# Patient Record
Sex: Male | Born: 1955 | Race: White | Hispanic: No | Marital: Single | Smoking: Current every day smoker
Health system: Southern US, Community
[De-identification: ages and names within clinical notes are randomized; demographics above are authoritative.]

## PROBLEM LIST (undated history)

## (undated) DIAGNOSIS — M1990 Unspecified osteoarthritis, unspecified site: Secondary | ICD-10-CM

## (undated) DIAGNOSIS — G709 Myoneural disorder, unspecified: Secondary | ICD-10-CM

## (undated) HISTORY — PX: APPENDECTOMY: SHX54

## (undated) HISTORY — DX: Unspecified osteoarthritis, unspecified site: M19.90

## (undated) HISTORY — DX: Myoneural disorder, unspecified: G70.9

---

## 2004-10-21 ENCOUNTER — Emergency Department (HOSPITAL_COMMUNITY): Admission: EM | Admit: 2004-10-21 | Discharge: 2004-10-21 | Payer: Self-pay | Admitting: Emergency Medicine

## 2005-02-19 ENCOUNTER — Ambulatory Visit (HOSPITAL_COMMUNITY): Admission: RE | Admit: 2005-02-19 | Discharge: 2005-02-21 | Payer: Self-pay | Admitting: Orthopedic Surgery

## 2006-04-08 ENCOUNTER — Ambulatory Visit (HOSPITAL_COMMUNITY): Admission: RE | Admit: 2006-04-08 | Discharge: 2006-04-08 | Payer: Self-pay | Admitting: Internal Medicine

## 2006-04-08 ENCOUNTER — Ambulatory Visit: Payer: Self-pay | Admitting: Internal Medicine

## 2006-04-15 ENCOUNTER — Ambulatory Visit: Payer: Self-pay | Admitting: Internal Medicine

## 2006-04-18 ENCOUNTER — Ambulatory Visit: Payer: Self-pay | Admitting: *Deleted

## 2006-04-21 ENCOUNTER — Encounter: Admission: RE | Admit: 2006-04-21 | Discharge: 2006-05-05 | Payer: Self-pay | Admitting: Internal Medicine

## 2006-10-27 ENCOUNTER — Ambulatory Visit: Payer: Self-pay | Admitting: Internal Medicine

## 2006-11-04 ENCOUNTER — Ambulatory Visit: Payer: Self-pay | Admitting: Internal Medicine

## 2007-01-04 ENCOUNTER — Encounter (INDEPENDENT_AMBULATORY_CARE_PROVIDER_SITE_OTHER): Payer: Self-pay | Admitting: *Deleted

## 2007-02-01 ENCOUNTER — Telehealth (INDEPENDENT_AMBULATORY_CARE_PROVIDER_SITE_OTHER): Payer: Self-pay | Admitting: *Deleted

## 2007-02-01 DIAGNOSIS — K219 Gastro-esophageal reflux disease without esophagitis: Secondary | ICD-10-CM | POA: Insufficient documentation

## 2007-02-01 DIAGNOSIS — M545 Low back pain: Secondary | ICD-10-CM

## 2007-02-01 DIAGNOSIS — E785 Hyperlipidemia, unspecified: Secondary | ICD-10-CM | POA: Insufficient documentation

## 2007-02-10 ENCOUNTER — Ambulatory Visit: Payer: Self-pay | Admitting: Internal Medicine

## 2007-02-10 DIAGNOSIS — G5600 Carpal tunnel syndrome, unspecified upper limb: Secondary | ICD-10-CM

## 2007-02-14 ENCOUNTER — Telehealth (INDEPENDENT_AMBULATORY_CARE_PROVIDER_SITE_OTHER): Payer: Self-pay | Admitting: Internal Medicine

## 2007-02-16 ENCOUNTER — Ambulatory Visit: Payer: Self-pay | Admitting: Internal Medicine

## 2007-03-07 LAB — CONVERTED CEMR LAB
Albumin: 4.4 g/dL (ref 3.5–5.2)
Alkaline Phosphatase: 43 units/L (ref 39–117)
CO2: 26 meq/L (ref 19–32)
Calcium: 9.7 mg/dL (ref 8.4–10.5)
Cholesterol: 208 mg/dL — ABNORMAL HIGH (ref 0–200)
Glucose, Bld: 100 mg/dL — ABNORMAL HIGH (ref 70–99)
Potassium: 5.1 meq/L (ref 3.5–5.3)
Sodium: 143 meq/L (ref 135–145)
Total Protein: 7 g/dL (ref 6.0–8.3)

## 2007-04-17 ENCOUNTER — Ambulatory Visit: Payer: Self-pay | Admitting: Nurse Practitioner

## 2007-04-17 DIAGNOSIS — M6530 Trigger finger, unspecified finger: Secondary | ICD-10-CM | POA: Insufficient documentation

## 2007-04-24 ENCOUNTER — Ambulatory Visit: Payer: Self-pay | Admitting: Internal Medicine

## 2007-04-26 ENCOUNTER — Encounter (INDEPENDENT_AMBULATORY_CARE_PROVIDER_SITE_OTHER): Payer: Self-pay | Admitting: Internal Medicine

## 2007-04-26 LAB — CONVERTED CEMR LAB
Albumin: 4.1 g/dL (ref 3.5–5.2)
Alkaline Phosphatase: 43 units/L (ref 39–117)
BUN: 24 mg/dL — ABNORMAL HIGH (ref 6–23)
Basophils Relative: 1 % (ref 0–1)
Chloride: 103 meq/L (ref 96–112)
Eosinophils Absolute: 0.1 10*3/uL (ref 0.0–0.7)
Eosinophils Relative: 2 % (ref 0–5)
Glucose, Bld: 85 mg/dL (ref 70–99)
HCT: 45.5 % (ref 39.0–52.0)
Hemoglobin: 14.7 g/dL (ref 13.0–17.0)
LDL Cholesterol: 103 mg/dL — ABNORMAL HIGH (ref 0–99)
MCHC: 32.3 g/dL (ref 30.0–36.0)
MCV: 90.5 fL (ref 78.0–100.0)
Monocytes Absolute: 0.6 10*3/uL (ref 0.1–1.0)
Monocytes Relative: 10 % (ref 3–12)
RBC: 5.03 M/uL (ref 4.22–5.81)
Total CHOL/HDL Ratio: 4.7
Triglycerides: 261 mg/dL — ABNORMAL HIGH (ref <150)
VLDL: 52 mg/dL — ABNORMAL HIGH (ref 0–40)

## 2007-05-11 ENCOUNTER — Ambulatory Visit: Payer: Self-pay | Admitting: Internal Medicine

## 2007-05-11 DIAGNOSIS — K089 Disorder of teeth and supporting structures, unspecified: Secondary | ICD-10-CM | POA: Insufficient documentation

## 2007-05-12 ENCOUNTER — Telehealth (INDEPENDENT_AMBULATORY_CARE_PROVIDER_SITE_OTHER): Payer: Self-pay | Admitting: Nurse Practitioner

## 2007-05-16 ENCOUNTER — Ambulatory Visit (HOSPITAL_COMMUNITY): Admission: RE | Admit: 2007-05-16 | Discharge: 2007-05-16 | Payer: Self-pay | Admitting: Internal Medicine

## 2007-11-21 ENCOUNTER — Ambulatory Visit: Payer: Self-pay | Admitting: Internal Medicine

## 2007-11-21 DIAGNOSIS — R197 Diarrhea, unspecified: Secondary | ICD-10-CM | POA: Insufficient documentation

## 2007-11-21 DIAGNOSIS — L301 Dyshidrosis [pompholyx]: Secondary | ICD-10-CM | POA: Insufficient documentation

## 2007-11-21 DIAGNOSIS — L290 Pruritus ani: Secondary | ICD-10-CM | POA: Insufficient documentation

## 2007-12-19 ENCOUNTER — Encounter (INDEPENDENT_AMBULATORY_CARE_PROVIDER_SITE_OTHER): Payer: Self-pay | Admitting: *Deleted

## 2007-12-19 LAB — CONVERTED CEMR LAB
OCCULT 1: NEGATIVE
OCCULT 3: NEGATIVE

## 2008-01-19 ENCOUNTER — Ambulatory Visit: Payer: Self-pay | Admitting: Internal Medicine

## 2008-02-23 ENCOUNTER — Ambulatory Visit: Payer: Self-pay | Admitting: Internal Medicine

## 2008-02-23 LAB — CONVERTED CEMR LAB
ALT: 32 units/L (ref 0–53)
Alkaline Phosphatase: 54 units/L (ref 39–117)
BUN: 17 mg/dL (ref 6–23)
Basophils Relative: 0 % (ref 0–1)
Chloride: 106 meq/L (ref 96–112)
Cholesterol: 196 mg/dL (ref 0–200)
Glucose, Bld: 110 mg/dL — ABNORMAL HIGH (ref 70–99)
Glucose, Urine, Semiquant: NEGATIVE
LDL Cholesterol: 102 mg/dL — ABNORMAL HIGH (ref 0–99)
Lymphs Abs: 2.4 10*3/uL (ref 0.7–4.0)
Monocytes Relative: 11 % (ref 3–12)
Neutro Abs: 2.2 10*3/uL (ref 1.7–7.7)
Neutrophils Relative %: 42 % — ABNORMAL LOW (ref 43–77)
Nitrite: NEGATIVE
Platelets: 232 10*3/uL (ref 150–400)
Potassium: 5.2 meq/L (ref 3.5–5.3)
RBC: 4.84 M/uL (ref 4.22–5.81)
Sodium: 142 meq/L (ref 135–145)
Total Bilirubin: 0.6 mg/dL (ref 0.3–1.2)
Total CHOL/HDL Ratio: 5.3
Total Protein: 6.8 g/dL (ref 6.0–8.3)
Triglycerides: 286 mg/dL — ABNORMAL HIGH (ref <150)
VLDL: 57 mg/dL — ABNORMAL HIGH (ref 0–40)
WBC Urine, dipstick: NEGATIVE
WBC: 5.3 10*3/uL (ref 4.0–10.5)
pH: 5

## 2008-03-02 ENCOUNTER — Encounter (INDEPENDENT_AMBULATORY_CARE_PROVIDER_SITE_OTHER): Payer: Self-pay | Admitting: Internal Medicine

## 2008-03-02 DIAGNOSIS — R7309 Other abnormal glucose: Secondary | ICD-10-CM | POA: Insufficient documentation

## 2008-12-05 ENCOUNTER — Ambulatory Visit: Payer: Self-pay | Admitting: Internal Medicine

## 2008-12-05 DIAGNOSIS — H6120 Impacted cerumen, unspecified ear: Secondary | ICD-10-CM | POA: Insufficient documentation

## 2008-12-05 DIAGNOSIS — L909 Atrophic disorder of skin, unspecified: Secondary | ICD-10-CM | POA: Insufficient documentation

## 2008-12-05 DIAGNOSIS — L919 Hypertrophic disorder of the skin, unspecified: Secondary | ICD-10-CM

## 2009-02-27 ENCOUNTER — Ambulatory Visit: Payer: Self-pay | Admitting: Internal Medicine

## 2009-02-27 LAB — CONVERTED CEMR LAB
ALT: 36 units/L (ref 0–53)
AST: 26 units/L (ref 0–37)
Alkaline Phosphatase: 53 units/L (ref 39–117)
Basophils Absolute: 0 10*3/uL (ref 0.0–0.1)
Basophils Relative: 1 % (ref 0–1)
CO2: 24 meq/L (ref 19–32)
Cholesterol: 191 mg/dL (ref 0–200)
Creatinine, Ser: 1.06 mg/dL (ref 0.40–1.50)
Eosinophils Relative: 2 % (ref 0–5)
Glucose, Urine, Semiquant: NEGATIVE
HCT: 46.5 % (ref 39.0–52.0)
Hemoglobin: 15.4 g/dL (ref 13.0–17.0)
MCHC: 33.1 g/dL (ref 30.0–36.0)
Monocytes Absolute: 0.5 10*3/uL (ref 0.1–1.0)
Neutro Abs: 2.7 10*3/uL (ref 1.7–7.7)
Platelets: 194 10*3/uL (ref 150–400)
RDW: 12.9 % (ref 11.5–15.5)
Total Bilirubin: 0.5 mg/dL (ref 0.3–1.2)
Total CHOL/HDL Ratio: 4.7
VLDL: 32 mg/dL (ref 0–40)
WBC Urine, dipstick: NEGATIVE
pH: 5

## 2009-03-07 ENCOUNTER — Ambulatory Visit: Payer: Self-pay | Admitting: Internal Medicine

## 2009-03-07 DIAGNOSIS — F1010 Alcohol abuse, uncomplicated: Secondary | ICD-10-CM

## 2009-03-07 DIAGNOSIS — R030 Elevated blood-pressure reading, without diagnosis of hypertension: Secondary | ICD-10-CM

## 2009-03-07 LAB — CONVERTED CEMR LAB
Glucose, Urine, Semiquant: NEGATIVE
Nitrite: NEGATIVE
Protein, U semiquant: 30
WBC Urine, dipstick: NEGATIVE

## 2009-04-07 ENCOUNTER — Encounter (INDEPENDENT_AMBULATORY_CARE_PROVIDER_SITE_OTHER): Payer: Self-pay | Admitting: Internal Medicine

## 2009-04-24 ENCOUNTER — Encounter (INDEPENDENT_AMBULATORY_CARE_PROVIDER_SITE_OTHER): Payer: Self-pay | Admitting: *Deleted

## 2009-04-28 ENCOUNTER — Ambulatory Visit: Payer: Self-pay | Admitting: Gastroenterology

## 2009-05-12 ENCOUNTER — Telehealth: Payer: Self-pay | Admitting: Gastroenterology

## 2009-09-04 ENCOUNTER — Telehealth (INDEPENDENT_AMBULATORY_CARE_PROVIDER_SITE_OTHER): Payer: Self-pay | Admitting: Internal Medicine

## 2009-09-09 ENCOUNTER — Telehealth (INDEPENDENT_AMBULATORY_CARE_PROVIDER_SITE_OTHER): Payer: Self-pay | Admitting: Internal Medicine

## 2009-09-09 ENCOUNTER — Ambulatory Visit: Payer: Self-pay | Admitting: Internal Medicine

## 2009-11-05 ENCOUNTER — Ambulatory Visit (HOSPITAL_COMMUNITY): Admission: RE | Admit: 2009-11-05 | Discharge: 2009-11-05 | Payer: Self-pay | Admitting: Internal Medicine

## 2009-11-21 ENCOUNTER — Ambulatory Visit (HOSPITAL_COMMUNITY): Admission: RE | Admit: 2009-11-21 | Discharge: 2009-11-21 | Payer: Self-pay | Admitting: Internal Medicine

## 2009-12-04 DIAGNOSIS — M5126 Other intervertebral disc displacement, lumbar region: Secondary | ICD-10-CM

## 2010-03-10 ENCOUNTER — Telehealth (INDEPENDENT_AMBULATORY_CARE_PROVIDER_SITE_OTHER): Payer: Self-pay | Admitting: Internal Medicine

## 2010-03-10 ENCOUNTER — Ambulatory Visit: Payer: Self-pay | Admitting: Internal Medicine

## 2010-03-10 DIAGNOSIS — R5383 Other fatigue: Secondary | ICD-10-CM

## 2010-03-10 DIAGNOSIS — E049 Nontoxic goiter, unspecified: Secondary | ICD-10-CM | POA: Insufficient documentation

## 2010-03-10 DIAGNOSIS — R5381 Other malaise: Secondary | ICD-10-CM

## 2010-03-10 DIAGNOSIS — R079 Chest pain, unspecified: Secondary | ICD-10-CM | POA: Insufficient documentation

## 2010-03-10 LAB — CONVERTED CEMR LAB
Blood Glucose, Fingerstick: 138
Blood in Urine, dipstick: NEGATIVE
Ketones, urine, test strip: NEGATIVE
Nitrite: NEGATIVE
Urobilinogen, UA: 0.2
WBC Urine, dipstick: NEGATIVE

## 2010-03-31 ENCOUNTER — Encounter (INDEPENDENT_AMBULATORY_CARE_PROVIDER_SITE_OTHER): Payer: Self-pay | Admitting: *Deleted

## 2010-03-31 LAB — CONVERTED CEMR LAB
AST: 21 units/L (ref 0–37)
Alkaline Phosphatase: 47 units/L (ref 39–117)
BUN: 8 mg/dL (ref 6–23)
Basophils Relative: 0 % (ref 0–1)
Creatinine, Ser: 0.98 mg/dL (ref 0.40–1.50)
Eosinophils Absolute: 0.1 10*3/uL (ref 0.0–0.7)
Glucose, Bld: 90 mg/dL (ref 70–99)
HCT: 46.4 % (ref 39.0–52.0)
Hemoglobin: 15.2 g/dL (ref 13.0–17.0)
LDL Cholesterol: 78 mg/dL (ref 0–99)
Lymphs Abs: 2.6 10*3/uL (ref 0.7–4.0)
MCHC: 32.8 g/dL (ref 30.0–36.0)
MCV: 92.6 fL (ref 78.0–100.0)
Monocytes Absolute: 0.6 10*3/uL (ref 0.1–1.0)
Monocytes Relative: 9 % (ref 3–12)
Total Protein: 6.7 g/dL (ref 6.0–8.3)
Triglycerides: 241 mg/dL — ABNORMAL HIGH (ref <150)
VLDL: 48 mg/dL — ABNORMAL HIGH (ref 0–40)

## 2010-04-08 ENCOUNTER — Encounter (INDEPENDENT_AMBULATORY_CARE_PROVIDER_SITE_OTHER): Payer: Self-pay | Admitting: Internal Medicine

## 2010-04-08 ENCOUNTER — Ambulatory Visit: Payer: Self-pay | Admitting: Internal Medicine

## 2010-04-15 ENCOUNTER — Encounter (INDEPENDENT_AMBULATORY_CARE_PROVIDER_SITE_OTHER): Payer: Self-pay | Admitting: *Deleted

## 2010-04-15 ENCOUNTER — Ambulatory Visit: Payer: Self-pay | Admitting: Internal Medicine

## 2010-04-29 ENCOUNTER — Encounter: Payer: Self-pay | Admitting: Internal Medicine

## 2010-04-29 ENCOUNTER — Ambulatory Visit
Admission: RE | Admit: 2010-04-29 | Discharge: 2010-04-29 | Payer: Self-pay | Source: Home / Self Care | Attending: Internal Medicine | Admitting: Internal Medicine

## 2010-04-29 DIAGNOSIS — D128 Benign neoplasm of rectum: Secondary | ICD-10-CM

## 2010-04-29 DIAGNOSIS — D129 Benign neoplasm of anus and anal canal: Secondary | ICD-10-CM

## 2010-05-05 ENCOUNTER — Encounter: Payer: Self-pay | Admitting: Internal Medicine

## 2010-05-19 NOTE — Letter (Signed)
Summary: [facility] Instructions  [facility]  [address] [place], [place] [address]   Phone: [phone]  Fax: [phone]       Travis Weber    03-27-1956    MRN: [id number]        Procedure Day /Date:  Monday 05/12/2009     Arrival Time:  9:30 am     Procedure Time:  10:30 am     Location of Procedure:                    _x _  [facility] (4th Floor)                        PREPARATION FOR COLONOSCOPY WITH MOVIPREP   Starting 5 days prior to your procedure Wednesday 1/19 do not eat nuts, seeds, popcorn, corn, beans, peas,  salads, or any raw vegetables.  Do not take any fiber supplements (e.g. Metamucil, Citrucel, and Benefiber).  THE DAY BEFORE YOUR PROCEDURE         DATE: Sunday 1/23  1.  Drink clear liquids the entire day-NO SOLID FOOD  2.  Do not drink anything colored red or purple.  Avoid juices with pulp.  No orange juice.  3.  Drink at least 64 oz. (8 glasses) of fluid/clear liquids during the day to prevent dehydration and help the prep work efficiently.  CLEAR LIQUIDS INCLUDE: Water Jello Ice Popsicles Tea (sugar ok, no milk/cream) Powdered fruit flavored drinks Coffee (sugar ok, no milk/cream) Gatorade Juice: apple, white grape, white cranberry  Lemonade Clear bullion, consomm, broth Carbonated beverages (any kind) Strained chicken noodle soup Hard Candy                             4.  In the morning, mix first dose of MoviPrep solution:    Empty 1 Pouch A and 1 Pouch B into the disposable container    Add lukewarm drinking water to the top line of the container. Mix to dissolve    Refrigerate (mixed solution should be used within 24 hrs)  5.  Begin drinking the prep at 5:00 p.m. The MoviPrep container is divided by 4 marks.   Every 15 minutes drink the solution down to the next mark (approximately 8 oz) until the full liter is complete.   6.  Follow completed prep with 16 oz of clear liquid of your choice (Nothing  red or purple).  Continue to drink clear liquids until bedtime.  7.  Before going to bed, mix second dose of MoviPrep solution:    Empty 1 Pouch A and 1 Pouch B into the disposable container    Add lukewarm drinking water to the top line of the container. Mix to dissolve    Refrigerate  THE DAY OF YOUR PROCEDURE      DATE: Monday 1/24  Beginning at 5:30 am (5 hours before procedure):         1. Every 15 minutes, drink the solution down to the next mark (approx 8 oz) until the full liter is complete.  2. Follow completed prep with 16 oz. of clear liquid of your choice.    3. You may drink clear liquids until 8:30 am (2 HOURS BEFORE PROCEDURE).   MEDICATION INSTRUCTIONS  Unless otherwise instructed, you should take regular prescription medications with a small sip of water   as early as possible the morning  of your procedure.          OTHER INSTRUCTIONS  You will need a responsible adult at least 57 years of age to accompany you and drive you home.   This person must remain in the waiting room during your procedure.  Wear loose fitting clothing that is easily removed.  Leave jewelry and other valuables at home.  However, you may wish to bring a book to read or  an iPod/MP3 player to listen to music as you wait for your procedure to start.  Remove all body piercing jewelry and leave at home.  Total time from sign-in until discharge is approximately 2-3 hours.  You should go home directly after your procedure and rest.  You can resume normal activities the  day after your procedure.  The day of your procedure you should not:   Drive   Make legal decisions   Operate machinery   Drink alcohol   Return to work  You will receive specific instructions about eating, activities and medications before you leave.    The above instructions have been reviewed and explained to me by   Ezra Sites RN  April 28, 2009 3:57 PM     I fully understand and can  verbalize these instructions _____________________________ Date _________

## 2010-05-19 NOTE — Assessment & Plan Note (Signed)
Summary: BACK PAIN//GK   Vital Signs:  Patient profile:   55 year old male Weight:      170 pounds Temp:     97.7 degrees F oral Pulse rate:   80 / minute Pulse rhythm:   regular Resp:     18 per minute BP sitting:   120 / 75  (left arm) Cuff size:   large  Vitals Entered By: Linzie Collin CC: Back pain,carrpol tunnel symptoms,pain down right leg, right side of back, taking tylenol during the day Is Patient Diabetic? No Pain Assessment Patient in pain? yes     Location: lower back Intensity: 4 Type: dull Onset of pain  Constant  Does patient need assistance? Functional Status Self care Ambulation Normal   CC:  Back pain, carrpol tunnel symptoms, pain down right leg, right side of back, and taking tylenol during the day.  History of Present Illness: 1.  Elevated BP:  BP was up a bit at CPE.  Fine today.  Exercising more so.  Weight down a bit.  Back and forth with alcohol.  Does use MJ and alcohol at times for pain.  2.  Low back pain--mainly on right with radiation down right legs.  Feels like he can never get comfortable.    3.  Carpal Tunnel, bilateral.  Left worse than right.  Bothers him throughout night--affects sleeping.  Also with symptoms now during day.  Wearing splints and takes Naproxen as needed   Current Medications (verified): 1)  Lovaza 1 Gm  Caps (Omega-3-Acid Ethyl Esters) .... 4 Caps By Mouth Daily 2)  Protonix 40 Mg  Tbec (Pantoprazole Sodium) .Marland Kitchen.. 1 Tab By Mouth Daily 3)  Lipitor 40 Mg  Tabs (Atorvastatin Calcium) .Marland Kitchen.. 1 Tab By Mouth Daily 4)  Naprosyn 500 Mg  Tabs (Naproxen) .... Take 1 Tablet By Mouth Two Times A Day As Needed 5)  Clobetasol Propionate 0.05 % Crea (Clobetasol Propionate) .... Apply Two Times A Day To Hands --Wear Gloves Over Hands After Applying At Night  Allergies (verified): No Known Drug Allergies  Physical Exam  Msk:  Tender over spinous processes of lumbar spine.  No paraspinous muscle tenderness. Extremities:   muscle mass of thenar and hypothenar eminences normal.   Neurologic:  strength normal in all extremities, gait normal, and DTRs symmetrical and normal.   Positive Tinels and Phalens bilaterally over median nerve at volar wrist Good grip bilaterally   Impression & Recommendations:  Problem # 1:  LOW BACK PAIN SYNDROME (ICD-724.2)  If Xray suggests a benefit, will set up with interventional radiology for injection. His updated medication list for this problem includes:    Naprosyn 500 Mg Tabs (Naproxen) .Marland Kitchen... Take 1 tablet by mouth two times a day as needed  Orders: Diagnostic X-Ray/Fluoroscopy (Diagnostic X-Ray/Flu)  Problem # 2:  CARPAL TUNNEL SYNDROME, BILATERAL (ICD-354.0)  Not sure cock up splints working well for him. Symptoms pretty much at night, but also having during day.    Orders: Orthopedic Surgeon Referral (Ortho Surgeon)  Complete Medication List: 1)  Lovaza 1 Gm Caps (Omega-3-acid ethyl esters) .... 4 caps by mouth daily 2)  Protonix 40 Mg Tbec (Pantoprazole sodium) .Marland Kitchen.. 1 tab by mouth daily 3)  Lipitor 40 Mg Tabs (Atorvastatin calcium) .Marland Kitchen.. 1 tab by mouth daily 4)  Naprosyn 500 Mg Tabs (Naproxen) .... Take 1 tablet by mouth two times a day as needed 5)  Clobetasol Propionate 0.05 % Crea (Clobetasol propionate) .... Apply two times a day to hands --  wear gloves over hands after applying at night  Patient Instructions: 1)  CPE in October with Dr. Ovidio Hanger it a fasting visit so get fasting labs Prescriptions: NAPROSYN 500 MG  TABS (NAPROXEN) Take 1 tablet by mouth two times a day as needed  #60 x 4   Entered and Authorized by:   Julieanne Manson MD   Signed by:   Julieanne Manson MD on 09/09/2009   Method used:   Faxed to ...       [facility] - [facility] (retail)       [address] [place], [place]  [address]       Ph: [phone]       Fax: [phone]   RxID:   [id number]   Appended Document: BACK  PAIN//GK pt given a new left cock up splint from donations--see if helps

## 2010-05-19 NOTE — Progress Notes (Signed)
Summary: GI referral for colonoscopy  Phone Note Outgoing Call   Summary of Call: Nora--GI referral for colonoscopy Initial call taken by: Julieanne Manson MD,  March 10, 2010 10:31 AM  Follow-up for Phone Call        I send the referral today to Brooke Dare because the pt was seeing in november and Enid Baas was on  call these month and they wont see it unless he have an office visit in December . Waiting for an appt Follow-up by: Cheryll Dessert,  March 20, 2010 11:16 AM

## 2010-05-19 NOTE — Progress Notes (Signed)
  Phone Note Outgoing Call   Summary of Call: Debra--see if can get in at [facility] ortho or [facility] for this with hand surgeon.  referral and letter. Initial call taken by: Julieanne Manson MD,  Sep 09, 2009 12:57 PM

## 2010-05-19 NOTE — Progress Notes (Signed)
Summary: Office Visit//DEPRESSION SCREENING  Office Visit//DEPRESSION SCREENING   Imported By: Arta Bruce 03/10/2010 14:41:56  _____________________________________________________________________  External Attachment:    Type:   Image     Comment:   External Document

## 2010-05-19 NOTE — Letter (Signed)
Summary: *Referral Letter  [facility]  [address] [place], [place] [address]   Phone: [phone]  Fax: [phone]    09/09/2009  Thank you in advance for agreeing to see my patient:  Travis Weber [address] [place], [place]  [address]  Phone: [phone]  Reason for Referral: Bilateral carpal tunnel syndrome not responding to conservative treatment.  Wearing cock up splints regularly and using Naproxen.  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) 10)  PRURITUS ANI (ICD-698.0) 11)  DENTAL PAIN (ICD-525.9) 12)  TRIGGER FINGER, LEFT THUMB (ICD-727.03) 13)  CARPAL TUNNEL SYNDROME, BILATERAL (ICD-354.0) 14)  LOW BACK PAIN SYNDROME (ICD-724.2) 15)  HYPERLIPIDEMIA (ICD-272.4) 16)  GERD (ICD-530.81)   Current Medications: 1)  LOVAZA 1 GM  CAPS (OMEGA-3-ACID ETHYL ESTERS) 4 caps by mouth daily 2)  PROTONIX 40 MG  TBEC (PANTOPRAZOLE SODIUM) 1 tab by mouth daily 3)  LIPITOR 40 MG  TABS (ATORVASTATIN CALCIUM) 1 tab by mouth daily 4)  NAPROSYN 500 MG  TABS (NAPROXEN) Take 1 tablet by mouth two times a day as needed 5)  CLOBETASOL PROPIONATE 0.05 % CREA (CLOBETASOL PROPIONATE) apply two times a day to hands --wear gloves over hands after applying at night   Past Medical History: 1)  DIARRHEA, RECURRENT (ICD-787.91) 2)  DYSHIDROTIC ECZEMA, 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  Sincerely,  Julieanne Manson MD

## 2010-05-19 NOTE — Progress Notes (Signed)
  Phone Note Outgoing Call   Summary of Call: Rec'd request for refill on Naproxen. Defer to Dr. Delrae Alfred.  Last seen in Nov 2010.  Was to f/u for BP in 2 mos.   Initial call taken by: Brynda Rim,  Sep 04, 2009 11:00 PM  Follow-up for Phone Call        Left message on answering machine for pt to return call. Follow-up by: Vesta Mixer CMA,  Sep 08, 2009 11:00 AM  Additional Follow-up for Phone Call Additional follow up Details #1::        Pt notified and Graciela to schedule appt. Additional Follow-up by: Vesta Mixer CMA,  Sep 08, 2009 12:11 PM    Prescriptions: NAPROSYN 500 MG  TABS (NAPROXEN) Take 1 tablet by mouth two times a day as needed  #60 x 1   Entered and Authorized by:   Julieanne Manson MD   Signed by:   Julieanne Manson MD on 09/08/2009   Method used:   Faxed to ...       [facility] - [facility] (retail)       [address] [place], [place]  [address]       Ph: [phone]       Fax: [phone]   RxID:   [id number]  Please call pt. and let him know he needs to come in for a bp check for more refills of Naproxen

## 2010-05-19 NOTE — Miscellaneous (Signed)
Summary: [facility]  Clinical Lists Changes  Medications: Added new medication of MOVIPREP 100 GM  SOLR (PEG-KCL-NACL-NASULF-NA ASC-C) As per prep instructions. - Signed Rx of MOVIPREP 100 GM  SOLR (PEG-KCL-NACL-NASULF-NA ASC-C) As per prep instructions.;  #1 x 0;  Signed;  Entered by: Ezra Sites RN;  Authorized by: Rachael Fee MD;  Method used: Electronically to [facility]*, [address], [place], [place]  [id number], Ph: [phone], Fax: [phone] Observations: Added new observation of NKA: T (04/28/2009 15:35)    Prescriptions: MOVIPREP 100 GM  SOLR (PEG-KCL-NACL-NASULF-NA ASC-C) As per prep instructions.  #1 x 0   Entered by:   Ezra Sites RN   Authorized by:   Rachael Fee MD   Signed by:   Ezra Sites RN on 04/28/2009   Method used:   Electronically to        [facility]* (retail)       [address]       [place], [place]  [id number]       Ph: [phone]       Fax: [phone]   RxID:   [id number]

## 2010-05-19 NOTE — Progress Notes (Signed)
Summary: No Show/Late Cancellation  ---- Converted from flag ---- ---- 05/09/2009 3:18 PM, Tawni Levy wrote:   ---- 05/09/2009 10:11 AM, Rachael Fee MD wrote: yes, that is too late to cancel  ---- 05/09/2009 10:09 AM, Tawni Levy wrote: Patient cancelled his procedure for Monday because he is having fianancial problems and problems with his insurance eligibility.   Are you going to charge? ------------------------------  Phone Note Other Incoming   Summary of Call: Patient BILLED. Initial call taken by: Leanor Kail [facility],  May 12, 2009 9:29 AM

## 2010-05-21 NOTE — Miscellaneous (Signed)
Summary: [facility] Previsit/prep  Clinical Lists Changes  Medications: Added new medication of DULCOLAX 5 MG  TBEC (BISACODYL) Day before procedure take 2 at 3pm and 2 at 8pm. - Signed Added new medication of METOCLOPRAMIDE HCL 10 MG  TABS (METOCLOPRAMIDE HCL) As per prep instructions. - Signed Added new medication of MIRALAX   POWD (POLYETHYLENE GLYCOL 3350) As per prep  instructions. - Signed Rx of DULCOLAX 5 MG  TBEC (BISACODYL) Day before procedure take 2 at 3pm and 2 at 8pm.;  #4 x 0;  Signed;  Entered by: Wyona Almas RN;  Authorized by: Hilarie Fredrickson MD;  Method used: Faxed to [facility], [address] [place], [place]  [address], Ph: [phone], Fax: [phone] Rx of METOCLOPRAMIDE HCL 10 MG  TABS (METOCLOPRAMIDE HCL) As per prep instructions.;  #2 x 0;  Signed;  Entered by: Wyona Almas RN;  Authorized by: Hilarie Fredrickson MD;  Method used: Faxed to [facility], [address] [place], [place]  [address], Ph: [phone], Fax: [phone] Rx of MIRALAX   POWD (POLYETHYLENE GLYCOL 3350) As per prep  instructions.;  #255gm x 0;  Signed;  Entered by: Wyona Almas RN;  Authorized by: Hilarie Fredrickson MD;  Method used: Faxed to [facility], [address] [place], [place]  [address], Ph: [phone], Fax: [phone]    Prescriptions: MIRALAX   POWD (POLYETHYLENE GLYCOL 3350) As per prep  instructions.  #255gm x 0   Entered by:   Wyona Almas RN   Authorized by:   Hilarie Fredrickson MD   Signed by:   Wyona Almas RN on 04/15/2010   Method used:   Faxed to ...       [facility] - [facility] (retail)       [address] [place], [place]  [address]       Ph: [phone]       Fax: [phone]   RxID:   [id number] METOCLOPRAMIDE HCL 10 MG  TABS (METOCLOPRAMIDE HCL) As per prep instructions.  #2 x 0   Entered by:   Wyona Almas RN  Authorized by:   Hilarie Fredrickson MD   Signed by:   Wyona Almas RN on 04/15/2010   Method used:   Faxed to ...       [facility] (retail)       [address] [place], [place]  [address]       Ph: [phone]       Fax: [phone]   RxID:   [id number] DULCOLAX 5 MG  TBEC (BISACODYL) Day before procedure take 2 at 3pm and 2 at 8pm.  #4 x 0   Entered by:   Wyona Almas RN   Authorized by:   Hilarie Fredrickson MD   Signed by:   Wyona Almas RN on 04/15/2010   Method used:   Faxed to ...       [facility] - Pharmac (retail)       [address] [place], [place]  [address]       Ph: [phone]       Fax: [phone]   RxID:   [id number]

## 2010-05-21 NOTE — Assessment & Plan Note (Signed)
Summary: CHOLESTEROL & FLP //MC   Vital Signs:  Patient profile:   55 year old male Height:      66 inches Weight:      164.31 pounds BMI:     26.62 BSA:     1.84 Temp:     96.8 degrees F oral Pulse rate:   72 / minute Pulse rhythm:   regular Resp:     18 per minute BP sitting:   118 / 80  (left arm) Cuff size:   regular  Vitals Entered By: Hale Drone, CMA (March 10, 2010 9:00 AM) CC: Here for a 55 Y/O CP.  Is Patient Diabetic? No Pain Assessment Patient in pain? no      CBG Result 138 CBG Device ID B Fasting  Does patient need assistance? Functional Status Self care Ambulation Normal  Vision Screening:Left eye with correction: 20 / 20 Right eye with correction: 20 / 20 Both eyes with correction: 20 / 20        Vision Entered By: Hale Drone CMA (March 10, 2010 9:13 AM)   CC:  Here for a 55 Y/O CP. Marland Kitchen  History of Present Illness: 55 yo male here for CPE.    Concerns:  1.  Hyperglycemia:  Has had some mild weight loss.  Was ill a few weeks ago--diarrhea.  Feels he has been eating better as he now lives in an airstream trailer and cooking more for himself.  No definite polydipsia or polyuria.  Has cut back on bread and carbs in regular diet, but does eat a lot of sweets/candy.  Current Medications (verified): 1)  Lovaza 1 Gm  Caps (Omega-3-Acid Ethyl Esters) .... 4 Caps By Mouth Daily 2)  Protonix 40 Mg  Tbec (Pantoprazole Sodium) .Marland Kitchen.. 1 Tab By Mouth Daily 3)  Lipitor 40 Mg  Tabs (Atorvastatin Calcium) .Marland Kitchen.. 1 Tab By Mouth Daily 4)  Naprosyn 500 Mg  Tabs (Naproxen) .... Take 1 Tablet By Mouth Two Times A Day As Needed 5)  Clobetasol Propionate 0.05 % Crea (Clobetasol Propionate) .... Apply Two Times A Day To Hands --Wear Gloves Over Hands After Applying At Night  Allergies (verified): No Known Drug Allergies  Past History:  Past Medical History: Reviewed history from 01/19/2008 and no changes required. 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 1.  2006:  Infected foreign body in left hand-- I and D with exploration.  Family History: Mother, 36:  Joint problems Father, died 54:  MI, hypercholesterolemia 5 siblings:  all with hypercholesterolemia, CTS, some with hypertension as well.  No DM. Daughter, 57:  Healthy  Social History: No longer lives with mother--she was in MVA--In [facility]. Artist -Careers adviser Divorced, sees daughter regularly--they recently moved to [place]. Tobacco:  started age 56, stopped mid 26s.  2 ppd--60 pack year history. Alcohol:  None in 3 weeks.  Has had difficulties in past.  Either drinks a lot or not at all.  Has done AA, support groups.  Feels he knows what he needs to do.  Discussed counseling here. Drugs:  MJ-uses nightly.  Eats more sweets with this.  No other drugs.  Review of Systems General:  Energy is down a bit.  Is planning to stop MJ and see if improves.  Does note he  gets emotionally flat for a month coming off MJ, then things improve.. Eyes:  glasses.  . ENT:  Not sure if hearing well--has to ask people to repeat themselves.. CV:  Denies palpitations; Chest discomfort for a couple of minutes--cannot say where in chest.  Was at rest when occurred.  SEveral friends with heart issues recently.  Pt. cannot give a good description.Marland Kitchen Resp:  Denies shortness of breath. GI:  Denies abdominal pain, bloody stools, constipation, dark tarry stools, and diarrhea. GU:  Denies dysuria, erectile dysfunction, urinary frequency, and urinary hesitancy. MS:  Denies joint pain, joint redness, and joint swelling. Derm:  Hands with dyshydrotic  exzema. Neuro:  Carpal tunnel not much of problem as not doing much currently.Marland Kitchen Psych:  Somewhat down.  Wants to see how he does with discontinuation of MJ/ETOH PHQ9 scored 4.  Physical Exam  General:  Well-developed,well-nourished,in no acute distress; alert,appropriate and cooperative throughout examination Head:  Normocephalic and atraumatic without obvious abnormalities. No apparent alopecia or balding. Eyes:  No corneal or conjunctival inflammation noted. EOMI. Perrla. Funduscopic exam benign, without hemorrhages, exudates or papilledema. Vision grossly normal. Ears:  External ear exam shows no significant lesions or deformities.  Otoscopic examination reveals clear canals, tympanic membranes are intact bilaterally without bulging, retraction, inflammation or discharge. Hearing is grossly normal bilaterally. Nose:  External nasal examination shows no deformity or inflammation. Nasal mucosa are pink and moist without lesions or exudates. Mouth:  Oral mucosa and oropharynx without lesions or exudates.  poor dentition and teeth missing.   Neck:  No deformities, masses, or tenderness noted.thyromegaly.   Chest Wall:  No deformities, masses, tenderness or gynecomastia noted. Lungs:  Normal respiratory effort, chest expands symmetrically. Lungs are clear to auscultation, no crackles or wheezes. Heart:  Normal rate and regular rhythm. S1 and S2 normal without gallop, murmur, click, rub or other extra sounds. Abdomen:  Bowel sounds positive,abdomen soft and non-tender without masses, organomegaly or hernias noted.  Well healed RLQ incisional scar Rectal:  No external abnormalities noted. Normal sphincter tone. No rectal masses or tenderness.  Heme negative light brown stool Genitalia:  Testes bilaterally descended without nodularity, tenderness or masses.Left varicocele unchanged. No penis lesions or urethral discharge. Prostate:  Prostate gland firm and smooth, no enlargement, nodularity,  tenderness, mass, asymmetry or induration. Msk:  No deformity or scoliosis noted of thoracic or lumbar spine.   Pulses:  R and L carotid,radial,femoral,dorsalis pedis and posterior tibial pulses are full and equal bilaterally Extremities:  No clubbing, cyanosis, edema, or deformity noted with normal full range of motion of all joints.   Neurologic:  No cranial nerve deficits noted. Station and gait are normal. Plantar reflexes are down-going bilaterally. DTRs are symmetrical throughout. Sensory, motor and coordinative functions appear intact. Skin:  Dry, cracking skin of hands/fingers Cervical Nodes:  No lymphadenopathy noted Axillary Nodes:  No palpable lymphadenopathy Inguinal Nodes:  No significant adenopathy Psych:  Cognition and judgment appear intact. Alert and cooperative with normal attention span and concentration. No apparent delusions, illusions, hallucinations   Impression & Recommendations:  Problem # 1:  HEALTH MAINTENANCE EXAM (ICD-V70.0)  PSA last year normal Guaiac cards x3 to return in 2 weeks. 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 not interested in referral to [facility] at this time--will call if he changes mind.  Problem # 4:  HYPERGLYCEMIA (ICD-790.29)  Orders: Hgb A1C ([address]UE)  Problem # 5:  CHEST PAIN (ICD-786.50) Pt. unable to characterize and sounds fleeting. Did not get EKG ordered today--will have pt. come back for that. Orders: T-CBC w/Diff (45409-81191)  Problem # 6:  FATIGUE (ICD-780.79)  Orders: T-CBC w/Diff (47829-56213)  Complete Medication List: 1)  Lovaza 1 Gm Caps (Omega-3-acid ethyl esters) .... 4 caps by mouth daily 2)  Protonix 40 Mg Tbec (Pantoprazole sodium) .Marland Kitchen.. 1 tab by mouth daily 3)  Lipitor  40 Mg Tabs (Atorvastatin calcium) .Marland Kitchen.. 1 tab by mouth daily 4)  Naprosyn 500 Mg Tabs (Naproxen) .... Take 1 tablet by mouth two times a day as needed 5)  Clobetasol Propionate 0.05 % Crea (Clobetasol propionate) .... Apply two times a day to hands --wear gloves over hands after applying at night  Other Orders: Capillary Blood Glucose/CBG (08657) T-Lipid Profile (84696-29528) T-Comprehensive Metabolic Panel (41324-40102)  Patient Instructions: 1)  Call if you do not hear about GI referral Prescriptions: CLOBETASOL PROPIONATE 0.05 % CREA (CLOBETASOL PROPIONATE) apply two times a day to hands --wear gloves over hands after applying at night  #60 g x 4   Entered and Authorized by:   Julieanne Manson MD   Signed by:   Julieanne Manson MD on 03/10/2010   Method used:   Faxed to ...       [facility] (retail)       [address] [place], [place]  [address]       Ph: [phone]       Fax: [phone]   RxID:   [id number] LIPITOR 40 MG  TABS (ATORVASTATIN CALCIUM) 1 tab by mouth daily  #30 x 11   Entered and Authorized by:   Julieanne Manson MD   Signed by:   Julieanne Manson MD on 03/10/2010   Method used:   Faxed to ...       [facility] - [facility] (retail)       [address] [place], [place]  [address]       Ph: [phone]       Fax: [phone]   RxID:   [id number] PROTONIX 40 MG  TBEC (PANTOPRAZOLE SODIUM) 1 tab by mouth daily  #30 x 11   Entered and Authorized by:   Julieanne Manson MD   Signed by:   Julieanne Manson MD on 03/10/2010   Method used:   Faxed to ...       [facility] (retail)       [address] [place], [place]  [address]       Ph: [phone]       Fax: [phone]   RxID:   [id number] LOVAZA 1 GM  CAPS (OMEGA-3-ACID ETHYL ESTERS) 4 caps by mouth daily  #120 x 11   Entered and Authorized by:    Julieanne Manson MD   Signed by:   Julieanne Manson MD on 03/10/2010   Method used:   Faxed to ...       [facility] - [facility] (retail)       [address] [place], [place]  [address]       Ph: [phone]       Fax: [phone]   RxID:   [id number]    Orders  Added: 1)  Capillary Blood Glucose/CBG [82948] 2)  Hgb A1C [83036QW] 3)  Est. Patient age 90-64 [6] 4)  Hemoccult Guaiac-1 spec.(in office) [82270] 5)  UA Dipstick w/o Micro (manual) [81002] 6)  T-Lipid Profile [80061-22930] 7)  T-Comprehensive Metabolic Panel [80053-22900] 8)  T-TSH [47425-95638] 9)  T-CBC w/Diff [75643-32951] 10)  Gastroenterology Referral [GI]     Preventive Care Screening  Prior Values:    PSA:  0.47 (02/27/2009)    Last Tetanus Booster:  Historical (04/19/2004)     Immunizations:  due for flu vaccine. STE:  No Guaiac Cards:  has done before--not in flow sheet. Colonoscopy:  never. Could not afford prep.   Diabetic Foot Exam Foot Inspection Is there limited ankle dorsiflexion?            Yes  Diabetic Foot Care Education   Laboratory Results   Urine Tests  Date/Time Received: March 10, 2010 10:49 AM   Routine Urinalysis   Color: yellow Glucose: negative   (Normal Range: Negative) Bilirubin: negative   (Normal Range: Negative) Ketone: negative   (Normal Range: Negative) Spec. Gravity: 1.010   (Normal Range: 1.003-1.035) Blood: negative   (Normal Range: Negative) pH: 7.5   (Normal Range: 5.0-8.0) Protein: negative   (Normal Range: Negative) Urobilinogen: 0.2   (Normal Range: 0-1) Nitrite: negative   (Normal Range: Negative) Leukocyte Esterace: negative   (Normal Range: Negative)     Blood Tests   Date/Time Received: March 10, 2010 10:48 AM   HGBA1C: 5.5%   (Normal Range: Non-Diabetic - 3-6%   Control Diabetic - 6-8%) CBG Random:: 138mg /dL

## 2010-05-21 NOTE — Procedures (Signed)
Summary: Colonoscopy  Patient: [name] Note: All result statuses are Final unless otherwise noted.  Tests: (1) Colonoscopy (COL)   COL Colonoscopy           DONE     [facility]     [address]     [place], [place]  [address]           COLONOSCOPY PROCEDURE REPORT           PATIENT:  Travis Weber, Travis Weber  MR#:  [id number]     BIRTHDATE:  02-19-1956, 54 yrs. old  GENDER:  male     ENDOSCOPIST:  Wilhemina Bonito. Eda Keys, MD     REF. BY:  Julieanne Manson, M.D.     PROCEDURE DATE:  04/29/2010     PROCEDURE:  Colonoscopy with snare polypectomy x 1     ASA CLASS:  Class II     INDICATIONS:  Routine Risk Screening     MEDICATIONS:   Fentanyl 75 mcg IV, Versed 8 mg IV           DESCRIPTION OF PROCEDURE:   After the risks benefits and     alternatives of the procedure were thoroughly explained, informed     consent was obtained.  Digital rectal exam was performed and     revealed no abnormalities.   The LB [id number] endoscope     was introduced through the anus and advanced to the cecum, which     was identified by both the appendix and ileocecal valve, without     limitations.Time to cecum = 2:24 min.  The quality of the prep was     excellent, using MoviPrep.  The instrument was then slowly     withdrawn (time = 11:11 min) as the colon was fully examined.     <<PROCEDUREIMAGES>>           FINDINGS:  A diminutive polyp was found in the mid transverse     colon. Polyp was snared without cautery. Retrieval was successful.     Otherwise normal colonoscopy without other polyps, masses,     vascular ectasias, or inflammatory changes.   Retroflexed views in     the rectum revealed no abnormalities.    The scope was then     withdrawn from the patient and the procedure completed.           COMPLICATIONS:  None     ENDOSCOPIC IMPRESSION:     1) Diminutive polyp in the mid transverse colon - removed     2) Otherwise normal colonoscopy           RECOMMENDATIONS:     1) Repeat  colonoscopy in 5 years if polyp adenomatous; otherwise     10 years           ______________________________     Wilhemina Bonito. Eda Keys, MD           CC:  Julieanne Manson, M.D., The Patient           n.     eSIGNED:   Wilhemina Bonito. Eda Keys at 04/29/2010 10:30 AM           Coletta Memos, [phone]  Note: An exclamation mark (!) indicates a result that was not dispersed into the flowsheet. Document Creation Date: 04/29/2010 10:31 AM _______________________________________________________________________  (1) Order result status: Final Collection or observation date-time: 04/29/2010 10:26 Requested date-time:  Receipt date-time:  Reported date-time:  Referring Physician:  Ordering Physician: Fransico Setters [phone]) Specimen Source:  Source: Launa Grill Order Number: [id number] Lab site:   Appended Document: Colonoscopy recall 10 yrs     Procedures Next Due Date:    Colonoscopy: 04/2020

## 2010-05-21 NOTE — Assessment & Plan Note (Signed)
Summary: no visit chrg for ekg//flu shot//kt  Nurse Visit   Vital Signs:  Patient profile:   55 year old male Temp:     98.3 degrees F oral  Vitals Entered By: Dutch Quint RN (April 08, 2010 10:33 AM)  CC:  EKG and flu vaccine.  History of Present Illness: Had CPE 03/10/10 -- here for EKG and flu vaccine.   Physical Exam  General:  alert, well-developed, well-nourished, and well-hydrated.     Impression & Recommendations:  Problem # 1:  HEALTH MAINTENANCE EXAM (ICD-V70.0)  EKG done Received flu vaccine  Orders: EKG w/ Interpretation (93000)  Complete Medication List: 1)  Lovaza 1 Gm Caps (Omega-3-acid ethyl esters) .... 4 caps by mouth daily 2)  Protonix 40 Mg Tbec (Pantoprazole sodium) .Marland Kitchen.. 1 tab by mouth daily 3)  Lipitor 40 Mg Tabs (Atorvastatin calcium) .Marland Kitchen.. 1 tab by mouth daily 4)  Naprosyn 500 Mg Tabs (Naproxen) .... Take 1 tablet by mouth two times a day as needed 5)  Clobetasol Propionate 0.05 % Crea (Clobetasol propionate) .... Apply two times a day to hands --wear gloves over hands after applying at night  Other Orders: Flu Vaccine 61yrs + ([address]) Admin 1st Vaccine (60[address])   Patient Instructions: 1)  You have received your flu vaccine and EKG. 2)  Call if anything changes or if you have any questions.  CC: EKG and flu vaccine Is Patient Diabetic? No Pain Assessment Patient in pain? no       Does patient need assistance? Functional Status Self care Ambulation Normal   Allergies: No Known Drug Allergies  Immunizations Administered:  Influenza Vaccine # 1:    Vaccine Type: Fluvax 3+    Site: left deltoid    Mfr: GlaxoSmithKline    Dose: 0.5 ml    Route: IM    Given by: Dutch Quint RN    Exp. Date: 10/17/2010    Lot #: [id number]    VIS given: 11/11/09 version given April 08, 2010.  Flu Vaccine Consent Questions:    Do you have a history of severe allergic reactions to this vaccine? no    Any prior history of allergic  reactions to egg and/or gelatin? no    Do you have a sensitivity to the preservative Thimersol? no    Do you have a past history of Guillan-Barre Syndrome? no    Do you currently have an acute febrile illness? no    Have you ever had a severe reaction to latex? no    Vaccine information given and explained to patient? yes  Orders Added: 1)  Flu Vaccine 46yrs + [90658] 2)  Admin 1st Vaccine [90471] 3)  EKG w/ Interpretation [93000]

## 2010-05-21 NOTE — Letter (Signed)
Summary: Patient Notice- Polyp Results  [facility]  [address] [place], [place] [address]   Phone: [phone]  Fax: [phone]        May 05, 2010 MRN: [id number]    Travis Weber [address] [place], [place]  [address]    Dear Travis Weber,  I am pleased to inform you that the colon polyp(s) removed during your recent colonoscopy was (were) found to be benign (no cancer detected) upon pathologic examination.  I recommend you have a repeat colonoscopy examination in 10 years to look for recurrent polyps, as having colon polyps increases your risk for having recurrent polyps or even colon cancer in the future.  Should you develop new or worsening symptoms of abdominal pain, bowel habit changes or bleeding from the rectum or bowels, please schedule an evaluation with either your primary care physician or with me.  Additional information/recommendations:  __ No further action with gastroenterology is needed at this time. Please      follow-up with your primary care physician for your other healthcare      needs.    Please call us if you are having persistent problems or have questions about your condition that have not been fully answered at this time.  Sincerely,  Hilarie Fredrickson MD  This letter has been electronically signed by your physician.  Appended Document: Patient Notice- Polyp Results Letter mailed

## 2010-05-21 NOTE — Letter (Signed)
Summary: [facility] Instructions  [facility]  [address] [place], [place] [address]   Phone: [phone]  Fax: [phone]       Travis Weber    1955/12/25    MRN: [id number]       Procedure Day Dorna Bloom:  [facility]  04/29/10     Arrival Time:  8:00AM     Procedure Time:  9:00AM     Location of Procedure:                    [name]  [facility] (4th Floor)   PREPARATION FOR COLONOSCOPY WITH MIRALAX  Starting 5 days prior to your procedure 04/24/10 do not eat nuts, seeds, popcorn, corn, beans, peas,  salads, or any raw vegetables.  Do not take any fiber supplements (e.g. Metamucil, Citrucel, and Benefiber). ____________________________________________________________________________________________________   THE DAY BEFORE YOUR PROCEDURE         DATE: 04/28/10   DAY:  TUESDAY  1   Drink clear liquids the entire day-NO SOLID FOOD  2   Do not drink anything colored red or purple.  Avoid juices with pulp.  No orange juice.  3   Drink at least 64 oz. (8 glasses) of fluid/clear liquids during the day to prevent dehydration and help the prep work efficiently.  CLEAR LIQUIDS INCLUDE: Water Jello Ice Popsicles Tea (sugar ok, no milk/cream) Powdered fruit flavored drinks Coffee (sugar ok, no milk/cream) Gatorade Juice: apple, white grape, white cranberry  Lemonade Clear bullion, consomm, broth Carbonated beverages (any kind) Strained chicken noodle soup Hard Candy  4   Mix the entire bottle of Miralax with 64 oz. of Gatorade/Powerade in the morning and put in the refrigerator to chill.  5   At 3:00 pm take 2 Dulcolax/Bisacodyl tablets.  6   At 4:30 pm take one Reglan/Metoclopramide tablet.  7  Starting at 5:00 pm drink one 8 oz glass of the Miralax mixture every 15-20 minutes until you have finished drinking the entire 64 oz.  You should finish drinking prep around 7:30 or 8:00 pm.  8   If you are nauseated, you may take the 2nd Reglan/Metoclopramide  tablet at 6:30 pm.        9    At 8:00 pm take 2 more DULCOLAX/Bisacodyl tablets.     THE DAY OF YOUR PROCEDURE      DATE:  04/29/10  DAY: Lulu Riding  You may drink clear liquids until 7:00AM  (2 HOURS BEFORE PROCEDURE).   MEDICATION INSTRUCTIONS  Unless otherwise instructed, you should take regular prescription medications with a small sip of water as early as possible the morning of your procedure.         OTHER INSTRUCTIONS  You will need a responsible adult at least 57 years of age to accompany you and drive you home.   This person must remain in the waiting room during your procedure.  Wear loose fitting clothing that is easily removed.  Leave jewelry and other valuables at home.  However, you may wish to bring a book to read or an iPod/MP3 player to listen to music as you wait for your procedure to start.  Remove all body piercing jewelry and leave at home.  Total time from sign-in until discharge is approximately 2-3 hours.  You should go home directly after your procedure and rest.  You can resume normal activities the day after your procedure.  The day of your procedure you should not:  Drive   Make legal decisions   Operate machinery   Drink alcohol   Return to work  You will receive specific instructions about eating, activities and medications before you leave.   The above instructions have been reviewed and explained to me by  .sign    I fully understand and can verbalize these instructions _____________________________ Date _______  Appended Document: Miralax Instructions Pt. Indigent, no insurance at all, no medicaid.  Gets his meds through [facility].

## 2010-05-21 NOTE — Letter (Signed)
Summary: Pre Visit Letter Revised  [facility]  [address] [place], [place] [address]   Phone: [phone]  Fax: [phone]        03/31/2010 MRN: [id number] Travis Weber [address] [place], [place]  [address]             Procedure Date:  04-29-10   Welcome to the Gastroenterology Division at [facility].    You are scheduled to see a nurse for your pre-procedure visit on 04-15-10 at 2:00P.M. on the 3rd floor at [facility], [address]  We ask that you try to arrive at our office 15 minutes prior to your appointment time to allow for check-in.  Please take a minute to review the attached form.  If you answer "Yes" to one or more of the questions on the first page, we ask that you call the person listed at your earliest opportunity.  If you answer "No" to all of the questions, please complete the rest of the form and bring it to your appointment.    Your nurse visit will consist of discussing your medical and surgical history, your immediate family medical history, and your medications.   If you are unable to list all of your medications on the form, please bring the medication bottles to your appointment and we will list them.  We will need to be aware of both prescribed and over the counter drugs.  We will need to know exact dosage information as well.    Please be prepared to read and sign documents such as consent forms, a financial agreement, and acknowledgement forms.  If necessary, and with your consent, a friend or relative is welcome to sit-in on the nurse visit with you.  Please bring your insurance card so that we may make a copy of it.  If your insurance requires a referral to see a specialist, please bring your referral form from your primary care physician.  No co-pay is required for this nurse visit.     If you cannot keep your appointment, please call [phone] to cancel or reschedule prior to your appointment date.  This allows [place] the  opportunity to schedule an appointment for another patient in need of care.    Thank you for choosing [facility] for your medical needs.  We appreciate the opportunity to care for you.  Please visit [place] at our website  to learn more about our practice.  Sincerely, The Gastroenterology Division

## 2010-06-15 ENCOUNTER — Encounter (INDEPENDENT_AMBULATORY_CARE_PROVIDER_SITE_OTHER): Payer: Self-pay | Admitting: Internal Medicine

## 2010-06-25 NOTE — Medication Information (Signed)
Summary: CONNECTION TO CARE  CONNECTION TO CARE   Imported By: Arta Bruce 06/17/2010 10:03:31  _____________________________________________________________________  External Attachment:    Type:   Image     Comment:   External Document

## 2010-09-04 NOTE — Op Note (Signed)
NAME:  Travis Weber, Travis Weber NO.:  [id number]   MEDICAL RECORD NO.:  [id number]          PATIENT TYPE:  OIB   LOCATION:  5709                         FACILITY:  Travis [facility]   PHYSICIAN:  Travis Weber, M.D. DATE OF BIRTH:  1955/11/13   DATE OF PROCEDURE:  02/19/2005  DATE OF DISCHARGE:                                 OPERATIVE REPORT   PREOPERATIVE DIAGNOSIS:  Chronic septic flexor tenosynovitis, left long  finger, with proximal extension into mid-palmar space and radio-opaque  retained foreign body, status post penetrating injury on February 03, 2005.   POSTOPERATIVE DIAGNOSIS:  Chronic septic flexor tenosynovitis, left long  finger, with proximal extension into mid-palmar space and radio-opaque  retained foreign body, status post penetrating injury on February 03, 2005,  with identification of a metallic foreign body, presumably a shard of  aluminum from a drill flute which was inadvertently trapped after a  penetrating injury on February 03, 2005 with an accidental drill bit finger  penetration.   OPERATION:  1.  Incision and drainage of left long finger flexor sheath with sterile      saline irrigation utilizing #5 pediatric feeding tube placed proximal to      the A1 pulley and passed distal to the A2 pulley.  2.  Incision and drainage of palmar abscess, left long finger P2 segment,      with debridement of granulation tissue and irrigation.  3.  Exploration of mid-palmar space and irrigation of mid-palmar space.   OPERATING SURGEON:  Travis Fitch. Sypher, MD   ASSISTANT:  No assistant.   ANESTHESIA:  General by LMA.   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   Travis Weber reports an accidental self-inflicted drill bit penetrating injury  to the proximal P1 segment of his left long finger  sustained on February 03, 2005.   At that time, he was holding a drill firmly as he was tightening a manual  chuck, when he lost control of the drill and accidentally pierced the palmar  surface of his left long finger.   At the time he did not perceive that a foreign body had penetrated deep to  the skin.   He cleaned the wound and observed it for more than 1 week.   He began to experience swelling, difficulty flexing the finger and rubor  involving the entire palmar surface of the finger and extending into the  palm near the distal and middle palmar creases.   He presented for evaluation and Travis Weber on February 17, 2005 and was  initially placed on IV Rocephin and oral antibiotics.   He had increasing pain, therefore he returned on February 18, 2005 and at  that time, a Hand Surgery consult was requested.   A phone consultation was received at our office on February 19, 2005.   It is not exactly clear whether why there a 24-hour delay between the  determination to obtain  Hand Surgery consult and contact with our office.   Travis Weber was contacted at home and advised to come for an immediate Hand  Surgery consult.   Clinical examination in our office confirmed all the cardinal signs of  flexor sheath infection including a flexed posture, pain on palpation of the  flexor sheath, pain on passive extension and definite distention of the  sheath including the mid-palmar region.   Plain films at the office documented a retained foreign body and 4+ swelling  of the palmar surface of the finger.   There was no sign of osteomyelitis.   We recommended that Travis Weber undergo incision and drainage for culture,  followed by admission for IV antibiotic therapy.  We anticipated use of a  pediatric feeding tube to irrigate the sheath and to drain the sheath for 24  hours postoperatively.   After informed consent, he is brought to the operating room at this time.   At the time of our  phone contact, he was eating a sandwich at 2:00 p.m.  Therefore, we were required to wait more than 6 hours to allow gastric  emptying.   As an Anesthesia consult with Travis Weber, surgery was scheduled after 8 p.m.  this evening.   PROCEDURE:  Travis Weber was brought to the operating room and placed in  supine position upon the operating table.   Following antibiotic Anesthesia consultation by Travis Weber, general  anesthesia by LMA was selected.   The anesthesia was induced under the direct supervision of Travis Weber,  followed by careful positioning of the left arm on an arm table with a  pneumatic tourniquet on the proximal brachium.   Following routine Betadine scrub and paint, the left arm was elevated to 1  minute and the arterial tourniquet inflated to 250 mmHg.   Procedure commenced with a Brunner zigzag incision planned from the mid-  palmar space distally.   Segmental incisions were fashioned, initially a zigzag incision over the PIP  flexion crease region which allowed exposure of the abscess and removal of  metallic foreign body.   Aerobic and anaerobic cultures were obtained from the abscess.   A second Brunner zigzag incision was fashioned, centered at the distal  palmar crease, exposing the proximal margin of the A1 pulley and allowing  proximal blunt dissection into the mid-palmar space adjacent to the  lumbrical muscles for the long finger and the region of the adductor  pollicis.   Purulent material was recovered from the mid-palmar space, but did not  appear to be collecting in the thenar space.   The flexor sheath was opened by release of the synovium and immediately  purulent material was recovered from beneath the A1 and A2 pulleys.  A #5  pediatric feeding tube was passed to the level of the distal A2 pulley and  used to continuously irrigate the flexor sheath with approximately 100 mL of sterile saline with a 20-mL syringe.   A 22-gauge needle  was then threaded beneath the A4 pulley distally from a  third incision that was fashioned exposing the A4-A5 pulley junction.   The 22-gauge Angiocath was threaded distally to irrigate the segment of the  flexor sheath proximal to A4 and later distal to A4.   After all premature was recovered, the palmar wound was closed with a single  apical suture of 5-0 nylon, securing the pediatric feeding tube in position.   The finger incisions will be allowed to close by secondary intention.  The hand was then dressed with Xeroflo, sterile gauze, 4 x 4's, Kerlix,  Webril and a dorsal plaster splint, securing the wrist in a neutral  position.   The tourniquet was released with immediate capillary refill to all fingers  and the thumb.   Mr. Strollo was awakened from anesthesia and transferred to the recovery room  with stable signs.  He will be admitted to the orthopedic floor for  observation of his vital signs, appropriately analgesics and IV vancomycin  through the vancomycin protocol under the supervision of the inpatient  pharmacy team.      Travis Weber, M.D.  Electronically Signed     RVS/MEDQ  D:  02/19/2005  T:  02/20/2005  Job:  [id number]

## 2011-05-18 ENCOUNTER — Ambulatory Visit (INDEPENDENT_AMBULATORY_CARE_PROVIDER_SITE_OTHER): Payer: Self-pay | Admitting: Internal Medicine

## 2011-05-18 VITALS — BP 140/96 | HR 78 | Temp 98.3°F | Resp 16 | Ht 65.5 in | Wt 155.4 lb

## 2011-05-18 DIAGNOSIS — G5600 Carpal tunnel syndrome, unspecified upper limb: Secondary | ICD-10-CM

## 2011-05-18 DIAGNOSIS — M542 Cervicalgia: Secondary | ICD-10-CM

## 2011-05-18 NOTE — Patient Instructions (Signed)
Carpal Tunnel Syndrome Carpal tunnel syndrome is a disorder of the nervous system in the wrist that causes pain, hand weakness, and/or loss of feeling. Carpal tunnel syndrome is caused by the compression, stretching, or irritation of the median nerve at the wrist joint. Athletes who experience carpal tunnel syndrome may notice a decrease in their performance to the condition, especially for sports that require strong hand or wrist action.   SYMPTOMS    Tingling, numbness, or burning pain in the hand or fingers.     Inability to sleep due to pain in the hand.     Sharp pains that shoot from the wrist up the arm or to the fingers, especially at night.     Morning stiffness or cramping of the hand.     Thumb weakness, resulting in difficulty holding objects or making a fist.     Shiny, dry skin on the hand.     Reduced performance in any sport requiring a strong grip.  CAUSES    Median nerve damage at the wrist is caused by pressure due to swelling, inflammation, or scarred tissue.     Sources of pressure include:     Repetitive gripping or squeezing that causes inflammation of the tendon sheaths.     Scarring or shortening of the ligament that covers the median nerve.     Traumatic injury to the wrist or forearm such as fracture, sprain, or dislocation.     Prolonged hyperextension (wrist bent backward) or hyperflexion (wrist bent downward) of the wrist.  RISK INCREASES WITH:  Diabetes mellitus.     Menopause or amenorrhea.     Rheumatoid arthritis.     Raynaud's disease.     Pregnancy.    Gout.    Kidney disease.     Ganglion cyst.     Repetitive hand or wrist action.     Hypothyroidism (underactive thyroid gland).     Repetitive jolting or shaking of the hands or wrist.     Prolonged forceful weight-bearing on the hands.  PREVENTION  Bracing the hand and wrist straight during activities that involve repetitive grasping.     For activities that require  prolonged extension of the wrist (bending towards the top of the forearm) periodically change the position of your wrists.     Learn and use proper technique in activities that result in the wrist position in neutral to slight extension.     Avoid bending the wrist into full extension or flexion (up or down)     Keep the wrist in a straight (neutral) position. To keep the wrist in this position, wear a splint.     Avoid repetitive hand and wrist motions.     When possible avoid prolonged grasping of items (steering wheel of a car, a pen, a vacuum cleaner, or a rake).     Loosen your grip for activities that require prolonged grasping of items.     Place keyboards and writing surfaces at the correct height as to decrease strain on the wrist and hand.     Alternate work tasks to avoid prolonged wrist flexion.     Avoid pinching activities (needlework and writing) as they may irritate your carpal tunnel syndrome.     If these activities are necessary, complete them for shorter periods of time.     When writing, use a felt tip or roller ball pen and/or build up the grip on a pen to decrease the forces required for writing.  PROGNOSIS  Carpal tunnel syndrome is usually curable with appropriate conservative treatment and sometimes resolves spontaneously. For some cases, surgery is necessary, especially if muscle wasting or nerve changes have developed.   RELATED COMPLICATIONS    Permanent numbness and a weak thumb or fingers in the affected hand.     Permanent paralysis of a portion of the hand and fingers.  TREATMENT   Treatment initially consists of stopping activities that aggravate the symptoms as well as medication and ice to reduce inflammation. A wrist splint is often recommended for wear during activities of repetitive motion as well as at night. It is also important to learn and use proper technique when performing activities that typically cause pain. On occasion, a corticosteroid  injection may be given. If symptoms persist despite conservative treatment, surgery may be an option. Surgical techniques free the pinched or compressed nerve. Carpal tunnel surgery is usually performed on an outpatient basis, meaning you go home the same day as surgery. These procedures provide almost complete relief of all symptoms in 95% of patients. Expect at least 2 weeks for healing after surgery. For cases that are the result of repeated jolting or shaking of the hand or wrist or prolonged hyperextension, surgery is not usually recommended, because stretching of the median nerve and not compression are usually the cause of carpal tunnel syndrome in these cases. MEDICATION    If pain medication is necessary, nonsteroidal anti-inflammatory medications, such as aspirin and ibuprofen, or other minor pain relievers, such as acetaminophen, are often recommended.     Do not take pain medication for 7 days before surgery.     Prescription pain relievers are usually only prescribed after surgery. Use only as directed and only as much as you need.     Corticosteroid injections may be given to reduce inflammation. However, they are not always recommended.     Vitamin B6 (pyridoxine) may reduce symptoms; use only if prescribed for your disorder.  SEEK MEDICAL CARE IF:    Symptoms get worse or do not improve in 2 weeks despite treatment.     You also have a current or recent history of neck or shoulder injury that has resulted in pain or tingling elsewhere in your arm.  Document Released: 04/05/2005 Document Revised: 12/16/2010 Document Reviewed: 07/18/2008 [facility] Patient Information 2012 [place], [place].Cervical Strain and Sprain (Whiplash) with Rehab Cervical strain and sprains are injuries that commonly occur with "whiplash" injuries. Whiplash occurs when the neck is forcefully whipped backward or forward, such as during a motor vehicle accident. The muscles, ligaments, tendons, discs and nerves  of the neck are susceptible to injury when this occurs. SYMPTOMS    Pain or stiffness in the front and/or back of neck     Symptoms may present immediately or up to 24 hours after injury.     Dizziness, headache, nausea and vomiting.     Muscle spasm with soreness and stiffness in the neck.     Tenderness and swelling at the injury site.  CAUSES   Whiplash injuries often occur during contact sports or motor vehicle accidents.   RISK INCREASES WITH:  Osteoarthritis of the spine.     Situations that make head or neck accidents or trauma more likely.     High-risk sports (football, rugby, wrestling, hockey, auto racing, gymnastics, diving, contact karate or boxing).     Poor strength and flexibility of the neck.     Previous neck injury.     Poor tackling technique.  Improperly fitted or padded equipment.  PREVENTION  Learn and use proper technique (avoid tackling with the head, spearing and head-butting; use proper falling techniques to avoid landing on the head).     Warm up and stretch properly before activity.     Maintain physical fitness:     Strength, flexibility and endurance.     Cardiovascular fitness.     Wear properly fitted and padded protective equipment, such as padded soft collars, for participation in contact sports.  PROGNOSIS   Recovery for cervical strain and sprain injuries is dependent on the extent of the injury. These injuries are usually curable in 1 week to 3 months with appropriate treatment.   RELATED COMPLICATIONS    Temporary numbness and weakness may occur if the nerve roots are damaged, and this may persist until the nerve has completely healed.     Chronic pain due to frequent recurrence of symptoms.     Prolonged healing, especially if activity is resumed too soon (before complete recovery).  TREATMENT   Treatment initially involves the use of ice and medication to help reduce pain and inflammation. It is also important to perform  strengthening and stretching exercises and modify activities that worsen symptoms so the injury does not get worse. These exercises may be performed at home or with a therapist. For patients who experience severe symptoms, a soft padded collar may be recommended to be worn around the neck.   Improving your posture may help reduce symptoms. Posture improvement includes pulling your chin and abdomen in while sitting or standing. If you are sitting, sit in a firm chair with your buttocks against the back of the chair. While sleeping, try replacing your pillow with a small towel rolled to 2 inches in diameter, or use a cervical pillow or soft cervical collar. Poor sleeping positions delay healing.   For patients with nerve root damage, which causes numbness or weakness, the use of a cervical traction apparatus may be recommended. Surgery is rarely necessary for these injuries. However, cervical strain and sprains that are present at birth (congenital) may require surgery. MEDICATION    If pain medication is necessary, nonsteroidal anti-inflammatory medications, such as aspirin and ibuprofen, or other minor pain relievers, such as acetaminophen, are often recommended.     Do not take pain medication for 7 days before surgery.     Prescription pain relievers may be given if deemed necessary by your caregiver. Use only as directed and only as much as you need.  HEAT AND COLD:    Cold treatment (icing) relieves pain and reduces inflammation. Cold treatment should be applied for 10 to 15 minutes every 2 to 3 hours for inflammation and pain and immediately after any activity that aggravates your symptoms. Use ice packs or an ice massage.     Heat treatment may be used prior to performing the stretching and strengthening activities prescribed by your caregiver, physical therapist, or athletic trainer. Use a heat pack or a warm soak.  SEEK MEDICAL CARE IF:    Symptoms get worse or do not improve in 2 weeks  despite treatment.     New, unexplained symptoms develop (drugs used in treatment may produce side effects).  EXERCISES RANGE OF MOTION (ROM) AND STRETCHING EXERCISES - Cervical Strain and Sprain These exercises may help you when beginning to rehabilitate your injury. In order to successfully resolve your symptoms, you must improve your posture. These exercises are designed to help reduce the forward-head and rounded-shoulder posture  which contributes to this condition. Your symptoms may resolve with or without further involvement from your physician, physical therapist or athletic trainer. While completing these exercises, remember:    Restoring tissue flexibility helps normal motion to return to the joints. This allows healthier, less painful movement and activity.     An effective stretch should be held for at least 20 seconds, although you may need to begin with shorter hold times for comfort.     A stretch should never be painful. You should only feel a gentle lengthening or release in the stretched tissue.  STRETCH- Axial Extensors  Lie on your back on the floor. You may bend your knees for comfort. Place a rolled up hand towel or dish towel, about 2 inches in diameter, under the part of your head that makes contact with the floor.     Gently tuck your chin, as if trying to make a "double chin," until you feel a gentle stretch at the base of your head.     Hold __________ seconds.  Repeat __________ times. Complete this exercise __________ times per day.   STRETECH - Axial Extension   Stand or sit on a firm surface. Assume a good posture: chest up, shoulders drawn back, abdominal muscles slightly tense, knees unlocked (if standing) and feet hip width apart.     Slowly retract your chin so your head slides back and your chin slightly lowers.Continue to look straight ahead.     You should feel a gentle stretch in the back of your head. Be certain not to feel an aggressive stretch since  this can cause headaches later.     Hold for __________ seconds.  Repeat __________ times. Complete this exercise __________ times per day. STRETCH - Cervical Side Bend   Stand or sit on a firm surface. Assume a good posture: chest up, shoulders drawn back, abdominal muscles slightly tense, knees unlocked (if standing) and feet hip width apart.     Without letting your nose or shoulders move, slowly tip your right / left ear to your shoulder until your feel a gentle stretch in the muscles on the opposite side of your neck.     Hold __________ seconds.  Repeat __________ times. Complete this exercise __________ times per day. STRETCH - Cervical Rotators   Stand or sit on a firm surface. Assume a good posture: chest up, shoulders drawn back, abdominal muscles slightly tense, knees unlocked (if standing) and feet hip width apart.     Keeping your eyes level with the ground, slowly turn your head until you feel a gentle stretch along the back and opposite side of your neck.     Hold __________ seconds.  Repeat __________ times. Complete this exercise __________ times per day. RANGE OF MOTION - Neck Circles   Stand or sit on a firm surface. Assume a good posture: chest up, shoulders drawn back, abdominal muscles slightly tense, knees unlocked (if standing) and feet hip width apart.     Gently roll your head down and around from the back of one shoulder to the back of the other. The motion should never be forced or painful.     Repeat the motion 10-20 times, or until you feel the neck muscles relax and loosen.  Repeat __________ times. Complete the exercise __________ times per day. STRENGTHENING EXERCISES - Cervical Strain and Sprain These exercises may help you when beginning to rehabilitate your injury. They may resolve your symptoms with or without further involvement from  your physician, physical therapist or athletic trainer. While completing these exercises, remember:    Muscles can  gain both the endurance and the strength needed for everyday activities through controlled exercises.     Complete these exercises as instructed by your physician, physical therapist or athletic trainer. Progress the resistance and repetitions only as guided.     You may experience muscle soreness or fatigue, but the pain or discomfort you are trying to eliminate should never worsen during these exercises. If this pain does worsen, stop and make certain you are following the directions exactly. If the pain is still present after adjustments, discontinue the exercise until you can discuss the trouble with your clinician.  STRENGTH - Cervical Flexors, Isometric  Face a wall, standing about 6 inches away. Place a small pillow, a ball about 6-8 inches in diameter, or a folded towel between your forehead and the wall.     Slightly tuck your chin and gently push your forehead into the soft object. Push only with mild to moderate intensity, building up tension gradually. Keep your jaw and forehead relaxed.     Hold 10 to 20 seconds. Keep your breathing relaxed.     Release the tension slowly. Relax your neck muscles completely before you start the next repetition.  Repeat __________ times. Complete this exercise __________ times per day. STRENGTH- Cervical Lateral Flexors, Isometric   Stand about 6 inches away from a wall. Place a small pillow, a ball about 6-8 inches in diameter, or a folded towel between the side of your head and the wall.     Slightly tuck your chin and gently tilt your head into the soft object. Push only with mild to moderate intensity, building up tension gradually. Keep your jaw and forehead relaxed.     Hold 10 to 20 seconds. Keep your breathing relaxed.     Release the tension slowly. Relax your neck muscles completely before you start the next repetition.  Repeat __________ times. Complete this exercise __________ times per day. STRENGTH - Cervical Extensors, Isometric     Stand about 6 inches away from a wall. Place a small pillow, a ball about 6-8 inches in diameter, or a folded towel between the back of your head and the wall.     Slightly tuck your chin and gently tilt your head back into the soft object. Push only with mild to moderate intensity, building up tension gradually. Keep your jaw and forehead relaxed.     Hold 10 to 20 seconds. Keep your breathing relaxed.     Release the tension slowly. Relax your neck muscles completely before you start the next repetition.  Repeat __________ times. Complete this exercise __________ times per day. POSTURE AND BODY MECHANICS CONSIDERATIONS - Cervical Strain and Sprain Keeping correct posture when sitting, standing or completing your activities will reduce the stress put on different body tissues, allowing injured tissues a chance to heal and limiting painful experiences. The following are general guidelines for improved posture. Your physician or physical therapist will provide you with any instructions specific to your needs. While reading these guidelines, remember:  The exercises prescribed by your provider will help you have the flexibility and strength to maintain correct postures.     The correct posture provides the optimal environment for your joints to work. All of your joints have less wear and tear when properly supported by a spine with good posture. This means you will experience a healthier, less painful body.  Correct posture must be practiced with all of your activities, especially prolonged sitting and standing. Correct posture is as important when doing repetitive low-stress activities (typing) as it is when doing a single heavy-load activity (lifting).  PROLONGED STANDING WHILE SLIGHTLY LEANING FORWARD When completing a task that requires you to lean forward while standing in one place for a long time, place either foot up on a stationary 2-4 inch high object to help maintain the best  posture. When both feet are on the ground, the low back tends to lose its slight inward curve. If this curve flattens (or becomes too large), then the back and your other joints will experience too much stress, fatigue more quickly and can cause pain.   RESTING POSITIONS Consider which positions are most painful for you when choosing a resting position. If you have pain with flexion-based activities (sitting, bending, stooping, squatting), choose a position that allows you to rest in a less flexed posture. You would want to avoid curling into a fetal position on your side. If your pain worsens with extension-based activities (prolonged standing, working overhead), avoid resting in an extended position such as sleeping on your stomach. Most people will find more comfort when they rest with their spine in a more neutral position, neither too rounded nor too arched. Lying on a non-sagging bed on your side with a pillow between your knees, or on your back with a pillow under your knees will often provide some relief. Keep in mind, being in any one position for a prolonged period of time, no matter how correct your posture, can still lead to stiffness. WALKING Walk with an upright posture. Your ears, shoulders and hips should all line-up. OFFICE WORK When working at a desk, create an environment that supports good, upright posture. Without extra support, muscles fatigue and lead to excessive strain on joints and other tissues. CHAIR:  A chair should be able to slide under your desk when your back makes contact with the back of the chair. This allows you to work closely.     The chair's height should allow your eyes to be level with the upper part of your monitor and your hands to be slightly lower than your elbows.     Body position:     Your feet should make contact with the floor. If this is not possible, use a foot rest.     Keep your ears over your shoulders. This will reduce stress on your neck and  low back.  Document Released: 04/05/2005 Document Revised: 12/16/2010 Document Reviewed: 07/18/2008 [facility] Patient Information 2012 [place], [place].Cervical Strain and Sprain (Whiplash) with Rehab Cervical strain and sprains are injuries that commonly occur with "whiplash" injuries. Whiplash occurs when the neck is forcefully whipped backward or forward, such as during a motor vehicle accident. The muscles, ligaments, tendons, discs and nerves of the neck are susceptible to injury when this occurs. SYMPTOMS    Pain or stiffness in the front and/or back of neck     Symptoms may present immediately or up to 24 hours after injury.     Dizziness, headache, nausea and vomiting.     Muscle spasm with soreness and stiffness in the neck.     Tenderness and swelling at the injury site.  CAUSES   Whiplash injuries often occur during contact sports or motor vehicle accidents.   RISK INCREASES WITH:  Osteoarthritis of the spine.     Situations that make head or neck accidents or trauma more  likely.     High-risk sports (football, rugby, wrestling, hockey, auto racing, gymnastics, diving, contact karate or boxing).     Poor strength and flexibility of the neck.     Previous neck injury.     Poor tackling technique.     Improperly fitted or padded equipment.  PREVENTION  Learn and use proper technique (avoid tackling with the head, spearing and head-butting; use proper falling techniques to avoid landing on the head).     Warm up and stretch properly before activity.     Maintain physical fitness:     Strength, flexibility and endurance.     Cardiovascular fitness.     Wear properly fitted and padded protective equipment, such as padded soft collars, for participation in contact sports.  PROGNOSIS   Recovery for cervical strain and sprain injuries is dependent on the extent of the injury. These injuries are usually curable in 1 week to 3 months with appropriate treatment.    RELATED COMPLICATIONS    Temporary numbness and weakness may occur if the nerve roots are damaged, and this may persist until the nerve has completely healed.     Chronic pain due to frequent recurrence of symptoms.     Prolonged healing, especially if activity is resumed too soon (before complete recovery).  TREATMENT   Treatment initially involves the use of ice and medication to help reduce pain and inflammation. It is also important to perform strengthening and stretching exercises and modify activities that worsen symptoms so the injury does not get worse. These exercises may be performed at home or with a therapist. For patients who experience severe symptoms, a soft padded collar may be recommended to be worn around the neck.   Improving your posture may help reduce symptoms. Posture improvement includes pulling your chin and abdomen in while sitting or standing. If you are sitting, sit in a firm chair with your buttocks against the back of the chair. While sleeping, try replacing your pillow with a small towel rolled to 2 inches in diameter, or use a cervical pillow or soft cervical collar. Poor sleeping positions delay healing.   For patients with nerve root damage, which causes numbness or weakness, the use of a cervical traction apparatus may be recommended. Surgery is rarely necessary for these injuries. However, cervical strain and sprains that are present at birth (congenital) may require surgery. MEDICATION    If pain medication is necessary, nonsteroidal anti-inflammatory medications, such as aspirin and ibuprofen, or other minor pain relievers, such as acetaminophen, are often recommended.     Do not take pain medication for 7 days before surgery.     Prescription pain relievers may be given if deemed necessary by your caregiver. Use only as directed and only as much as you need.  HEAT AND COLD:    Cold treatment (icing) relieves pain and reduces inflammation. Cold treatment  should be applied for 10 to 15 minutes every 2 to 3 hours for inflammation and pain and immediately after any activity that aggravates your symptoms. Use ice packs or an ice massage.     Heat treatment may be used prior to performing the stretching and strengthening activities prescribed by your caregiver, physical therapist, or athletic trainer. Use a heat pack or a warm soak.  SEEK MEDICAL CARE IF:    Symptoms get worse or do not improve in 2 weeks despite treatment.     New, unexplained symptoms develop (drugs used in treatment may produce side effects).  EXERCISES RANGE OF MOTION (ROM) AND STRETCHING EXERCISES - Cervical Strain and Sprain These exercises may help you when beginning to rehabilitate your injury. In order to successfully resolve your symptoms, you must improve your posture. These exercises are designed to help reduce the forward-head and rounded-shoulder posture which contributes to this condition. Your symptoms may resolve with or without further involvement from your physician, physical therapist or athletic trainer. While completing these exercises, remember:    Restoring tissue flexibility helps normal motion to return to the joints. This allows healthier, less painful movement and activity.     An effective stretch should be held for at least 20 seconds, although you may need to begin with shorter hold times for comfort.     A stretch should never be painful. You should only feel a gentle lengthening or release in the stretched tissue.  STRETCH- Axial Extensors  Lie on your back on the floor. You may bend your knees for comfort. Place a rolled up hand towel or dish towel, about 2 inches in diameter, under the part of your head that makes contact with the floor.     Gently tuck your chin, as if trying to make a "double chin," until you feel a gentle stretch at the base of your head.     Hold __________ seconds.  Repeat __________ times. Complete this exercise __________  times per day.   STRETECH - Axial Extension   Stand or sit on a firm surface. Assume a good posture: chest up, shoulders drawn back, abdominal muscles slightly tense, knees unlocked (if standing) and feet hip width apart.     Slowly retract your chin so your head slides back and your chin slightly lowers.Continue to look straight ahead.     You should feel a gentle stretch in the back of your head. Be certain not to feel an aggressive stretch since this can cause headaches later.     Hold for __________ seconds.  Repeat __________ times. Complete this exercise __________ times per day. STRETCH - Cervical Side Bend   Stand or sit on a firm surface. Assume a good posture: chest up, shoulders drawn back, abdominal muscles slightly tense, knees unlocked (if standing) and feet hip width apart.     Without letting your nose or shoulders move, slowly tip your right / left ear to your shoulder until your feel a gentle stretch in the muscles on the opposite side of your neck.     Hold __________ seconds.  Repeat __________ times. Complete this exercise __________ times per day. STRETCH - Cervical Rotators   Stand or sit on a firm surface. Assume a good posture: chest up, shoulders drawn back, abdominal muscles slightly tense, knees unlocked (if standing) and feet hip width apart.     Keeping your eyes level with the ground, slowly turn your head until you feel a gentle stretch along the back and opposite side of your neck.     Hold __________ seconds.  Repeat __________ times. Complete this exercise __________ times per day. RANGE OF MOTION - Neck Circles   Stand or sit on a firm surface. Assume a good posture: chest up, shoulders drawn back, abdominal muscles slightly tense, knees unlocked (if standing) and feet hip width apart.     Gently roll your head down and around from the back of one shoulder to the back of the other. The motion should never be forced or painful.     Repeat the motion  10-20 times, or  until you feel the neck muscles relax and loosen.  Repeat __________ times. Complete the exercise __________ times per day. STRENGTHENING EXERCISES - Cervical Strain and Sprain These exercises may help you when beginning to rehabilitate your injury. They may resolve your symptoms with or without further involvement from your physician, physical therapist or athletic trainer. While completing these exercises, remember:    Muscles can gain both the endurance and the strength needed for everyday activities through controlled exercises.     Complete these exercises as instructed by your physician, physical therapist or athletic trainer. Progress the resistance and repetitions only as guided.     You may experience muscle soreness or fatigue, but the pain or discomfort you are trying to eliminate should never worsen during these exercises. If this pain does worsen, stop and make certain you are following the directions exactly. If the pain is still present after adjustments, discontinue the exercise until you can discuss the trouble with your clinician.  STRENGTH - Cervical Flexors, Isometric  Face a wall, standing about 6 inches away. Place a small pillow, a ball about 6-8 inches in diameter, or a folded towel between your forehead and the wall.     Slightly tuck your chin and gently push your forehead into the soft object. Push only with mild to moderate intensity, building up tension gradually. Keep your jaw and forehead relaxed.     Hold 10 to 20 seconds. Keep your breathing relaxed.     Release the tension slowly. Relax your neck muscles completely before you start the next repetition.  Repeat __________ times. Complete this exercise __________ times per day. STRENGTH- Cervical Lateral Flexors, Isometric   Stand about 6 inches away from a wall. Place a small pillow, a ball about 6-8 inches in diameter, or a folded towel between the side of your head and the wall.     Slightly  tuck your chin and gently tilt your head into the soft object. Push only with mild to moderate intensity, building up tension gradually. Keep your jaw and forehead relaxed.     Hold 10 to 20 seconds. Keep your breathing relaxed.     Release the tension slowly. Relax your neck muscles completely before you start the next repetition.  Repeat __________ times. Complete this exercise __________ times per day. STRENGTH - Cervical Extensors, Isometric   Stand about 6 inches away from a wall. Place a small pillow, a ball about 6-8 inches in diameter, or a folded towel between the back of your head and the wall.     Slightly tuck your chin and gently tilt your head back into the soft object. Push only with mild to moderate intensity, building up tension gradually. Keep your jaw and forehead relaxed.     Hold 10 to 20 seconds. Keep your breathing relaxed.     Release the tension slowly. Relax your neck muscles completely before you start the next repetition.  Repeat __________ times. Complete this exercise __________ times per day. POSTURE AND BODY MECHANICS CONSIDERATIONS - Cervical Strain and Sprain Keeping correct posture when sitting, standing or completing your activities will reduce the stress put on different body tissues, allowing injured tissues a chance to heal and limiting painful experiences. The following are general guidelines for improved posture. Your physician or physical therapist will provide you with any instructions specific to your needs. While reading these guidelines, remember:  The exercises prescribed by your provider will help you have the flexibility and strength to maintain correct  postures.     The correct posture provides the optimal environment for your joints to work. All of your joints have less wear and tear when properly supported by a spine with good posture. This means you will experience a healthier, less painful body.     Correct posture must be practiced with  all of your activities, especially prolonged sitting and standing. Correct posture is as important when doing repetitive low-stress activities (typing) as it is when doing a single heavy-load activity (lifting).  PROLONGED STANDING WHILE SLIGHTLY LEANING FORWARD When completing a task that requires you to lean forward while standing in one place for a long time, place either foot up on a stationary 2-4 inch high object to help maintain the best posture. When both feet are on the ground, the low back tends to lose its slight inward curve. If this curve flattens (or becomes too large), then the back and your other joints will experience too much stress, fatigue more quickly and can cause pain.   RESTING POSITIONS Consider which positions are most painful for you when choosing a resting position. If you have pain with flexion-based activities (sitting, bending, stooping, squatting), choose a position that allows you to rest in a less flexed posture. You would want to avoid curling into a fetal position on your side. If your pain worsens with extension-based activities (prolonged standing, working overhead), avoid resting in an extended position such as sleeping on your stomach. Most people will find more comfort when they rest with their spine in a more neutral position, neither too rounded nor too arched. Lying on a non-sagging bed on your side with a pillow between your knees, or on your back with a pillow under your knees will often provide some relief. Keep in mind, being in any one position for a prolonged period of time, no matter how correct your posture, can still lead to stiffness. WALKING Walk with an upright posture. Your ears, shoulders and hips should all line-up. OFFICE WORK When working at a desk, create an environment that supports good, upright posture. Without extra support, muscles fatigue and lead to excessive strain on joints and other tissues. CHAIR:  A chair should be able to slide  under your desk when your back makes contact with the back of the chair. This allows you to work closely.     The chair's height should allow your eyes to be level with the upper part of your monitor and your hands to be slightly lower than your elbows.     Body position:     Your feet should make contact with the floor. If this is not possible, use a foot rest.     Keep your ears over your shoulders. This will reduce stress on your neck and low back.  Document Released: 04/05/2005 Document Revised: 12/16/2010 Document Reviewed: 07/18/2008 [facility] Patient Information 2012 [place], [place].

## 2011-05-18 NOTE — Progress Notes (Signed)
Yoga. Patient ID: Travis Weber, male   DOB: 12-18-1955, 56 y.o.   MRN: [id number] Travis Weber is referred from [facility] because he now has a part-time job. He suffers from chronic neck pain low back pain and carpal tunnel syndrome. He has been maintained on tramadol and Neurontin for the past few years. He uses night splints and wrist splints that were that were given for work. His neck now prevents him from sleeping. It centers in the left trapezius. He has no weakness in his left hand area.  Since he has been on tramadol he has noticed a great improvement in his and his affect. He has been able to stop all substance use in stop smoking. 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  Physical exam  He has tenderness in the left posterior cervical area that extends into the proximal part of the trapezius. This is made worse bowel neck range of motion. The shoulder exam is normal. Both wrists when flexed to create tingling in the hand.    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 Plan #1 yoga. Plan #2 add Flexeril at bedtime plan #3 eight-day course of prednisone plan #4 increase tramadol to 24 times a day impression  Impression #2 carpal tunnel syndrome  Plan continue current approach and seek injections when available.  He will return in 6 months.

## 2011-08-29 IMAGING — CR DG LUMBAR SPINE COMPLETE 4+V
5 series · 5 of 5 positions shown · non-contrast
Comparison: Lumbar spine radiographs 04/08/2006.

CLINICAL DATA: Chronic low back and right leg pain.

LUMBAR SPINE - COMPLETE 4+ VIEW

[t l-spine a.p.]
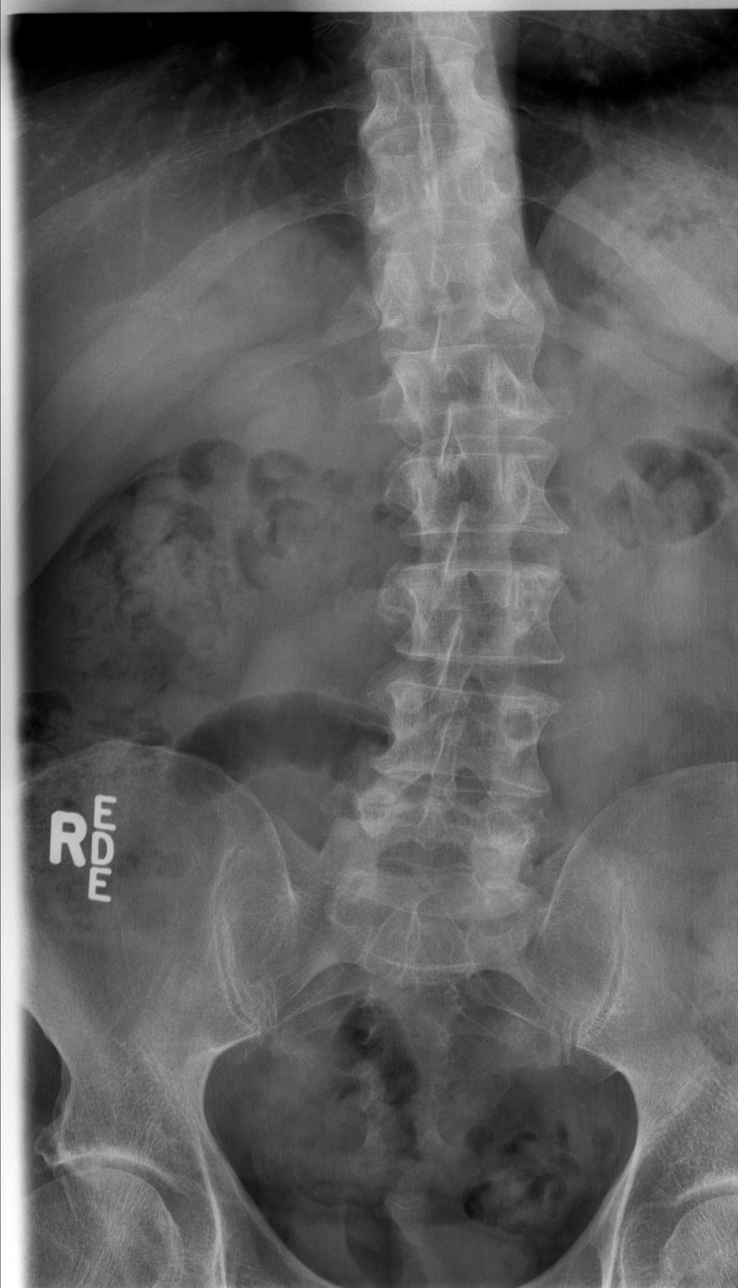

[t l-spine oblique exposure (1 of 2)]
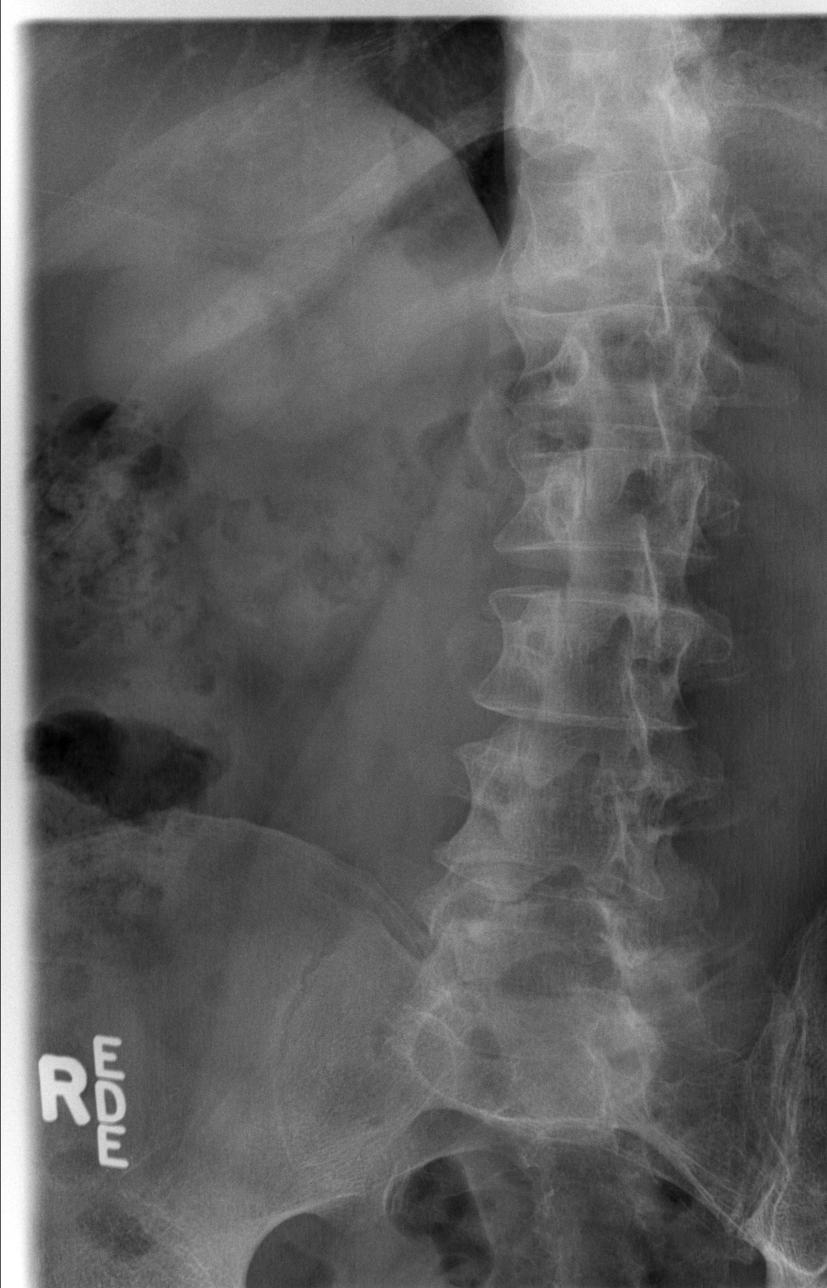

[t l-spine oblique exposure (2 of 2)]
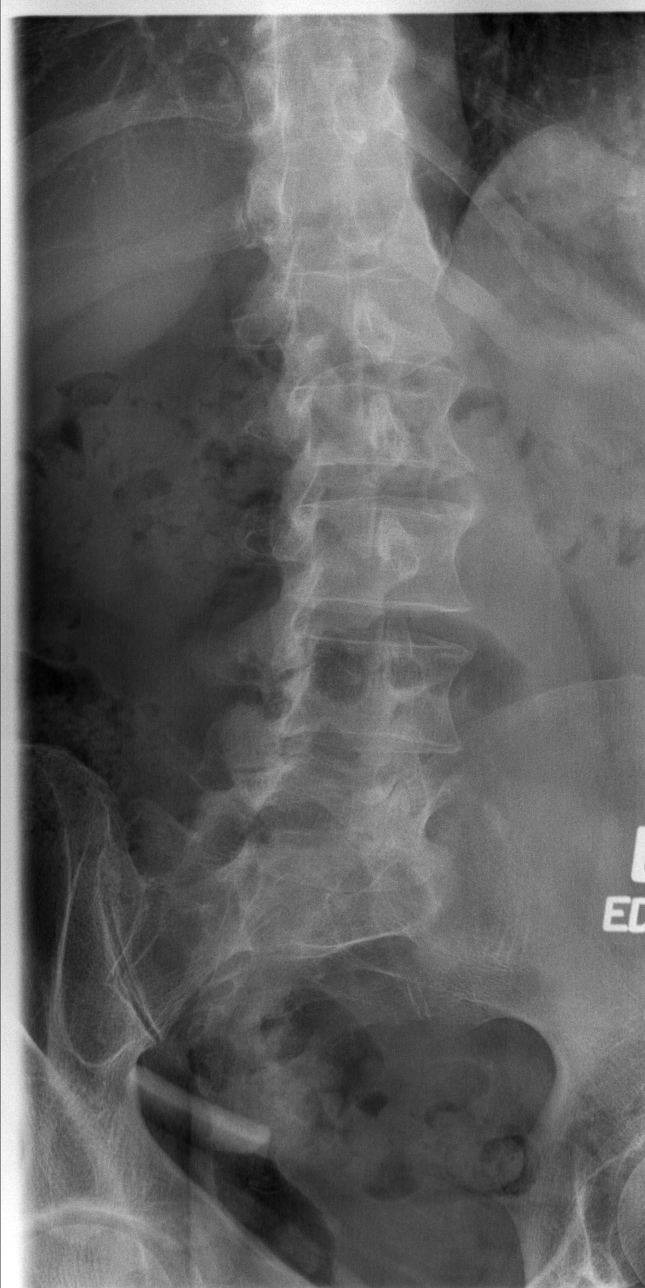

[t l-spine lat]
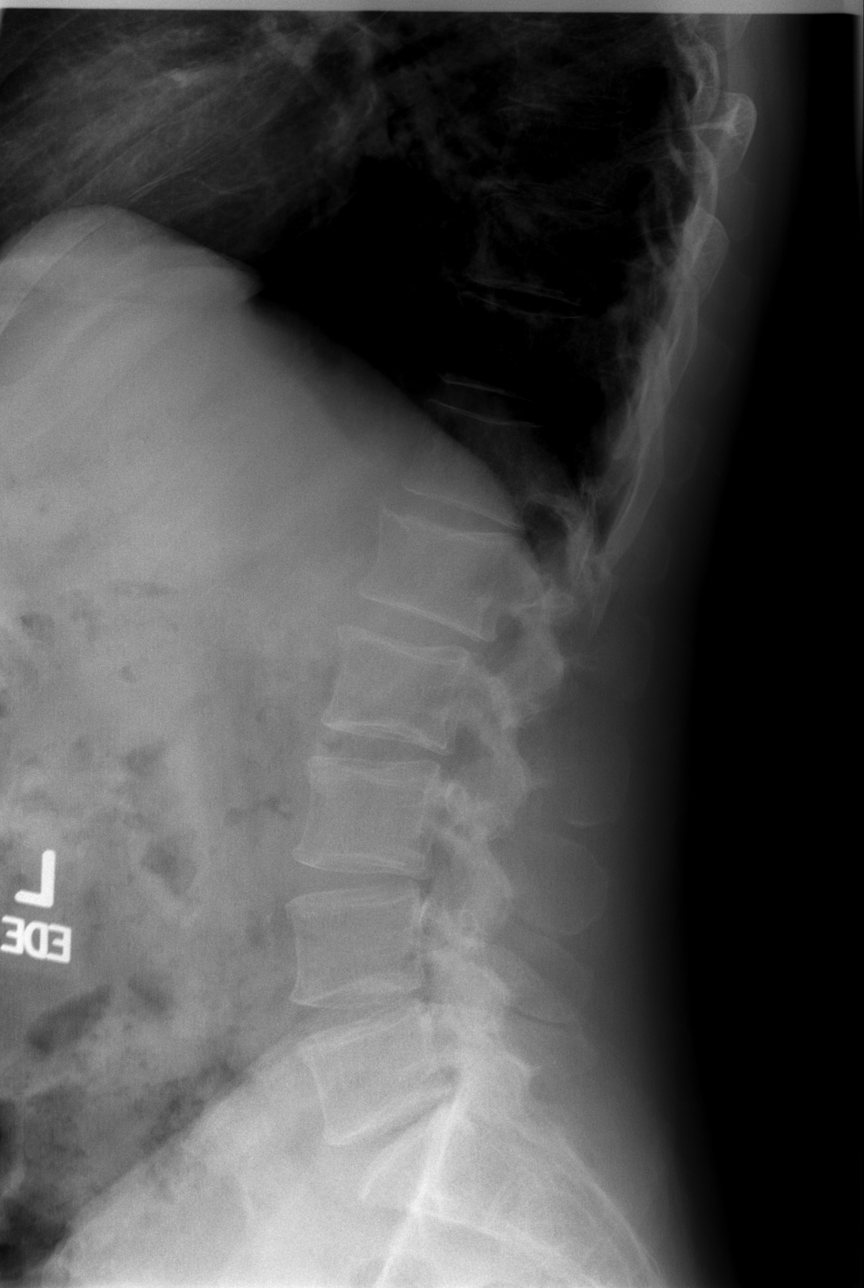

[t l-spine l5-s1 spot]
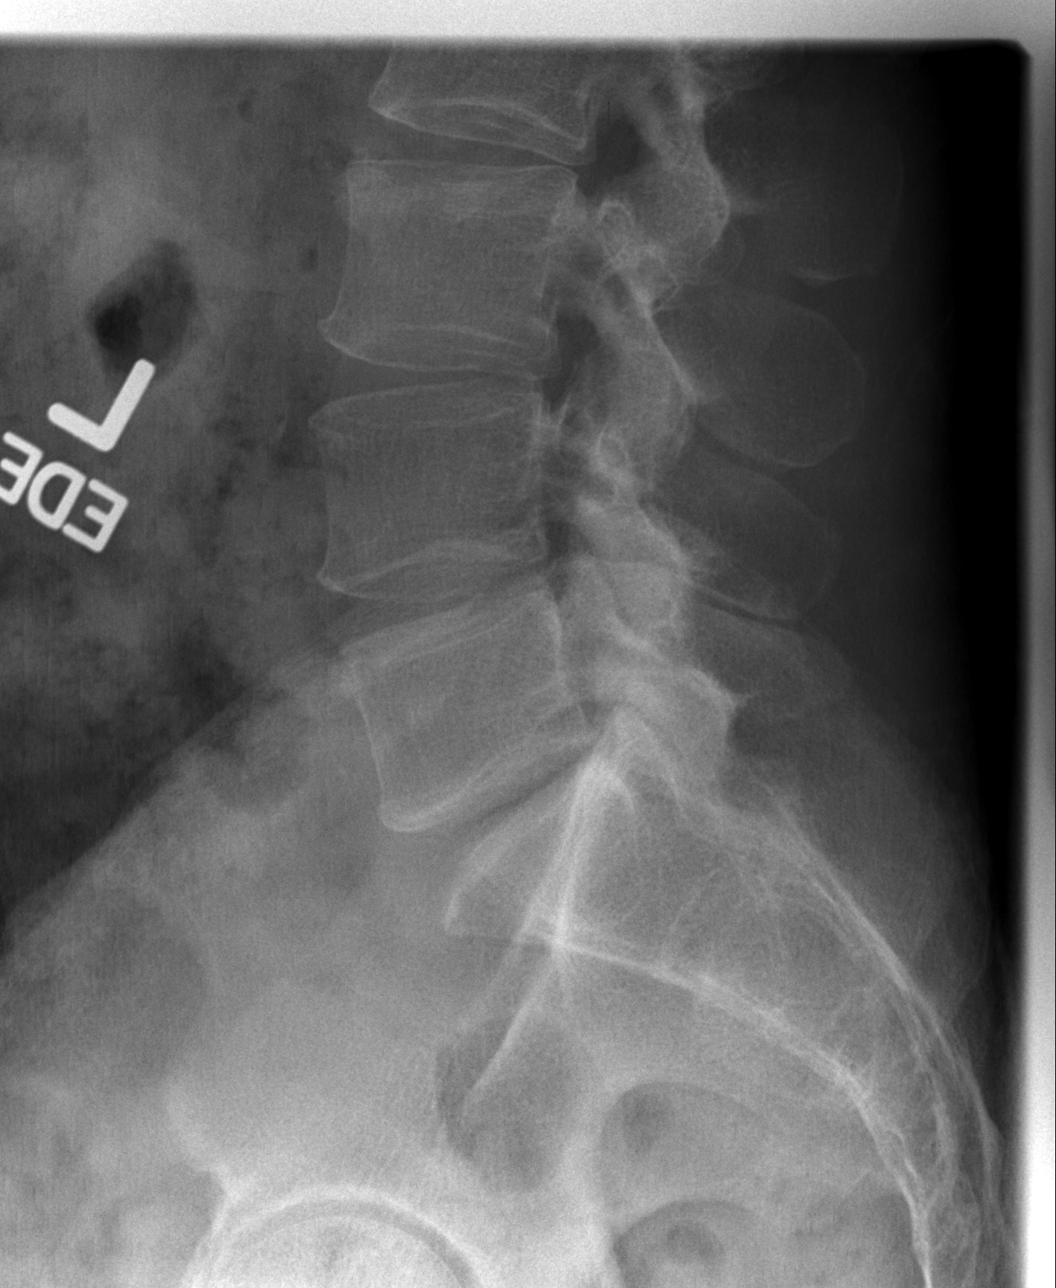

[5 of 5 positions shown; findings below may reference images not displayed]

FINDINGS: There is a stable mild convex left scoliosis.  The
lateral alignment is normal.  Scattered endplate degenerative
changes and mild disc space loss, most advanced at L5-S1, are
unchanged.  There are stable facet degenerative changes inferiorly.
No fracture or pars defect is identified.
IMPRESSION: Stable lumbar spondylosis.  No acute osseous findings.

## 2011-09-14 IMAGING — CR DG ORBITS FOR FOREIGN BODY
2 series · 2 of 2 positions shown · non-contrast
Comparison: None.

CLINICAL DATA: Pre MRI screen

ORBITS FOR FOREIGN BODY - 2 VIEW

[w waters (1 of 2)]
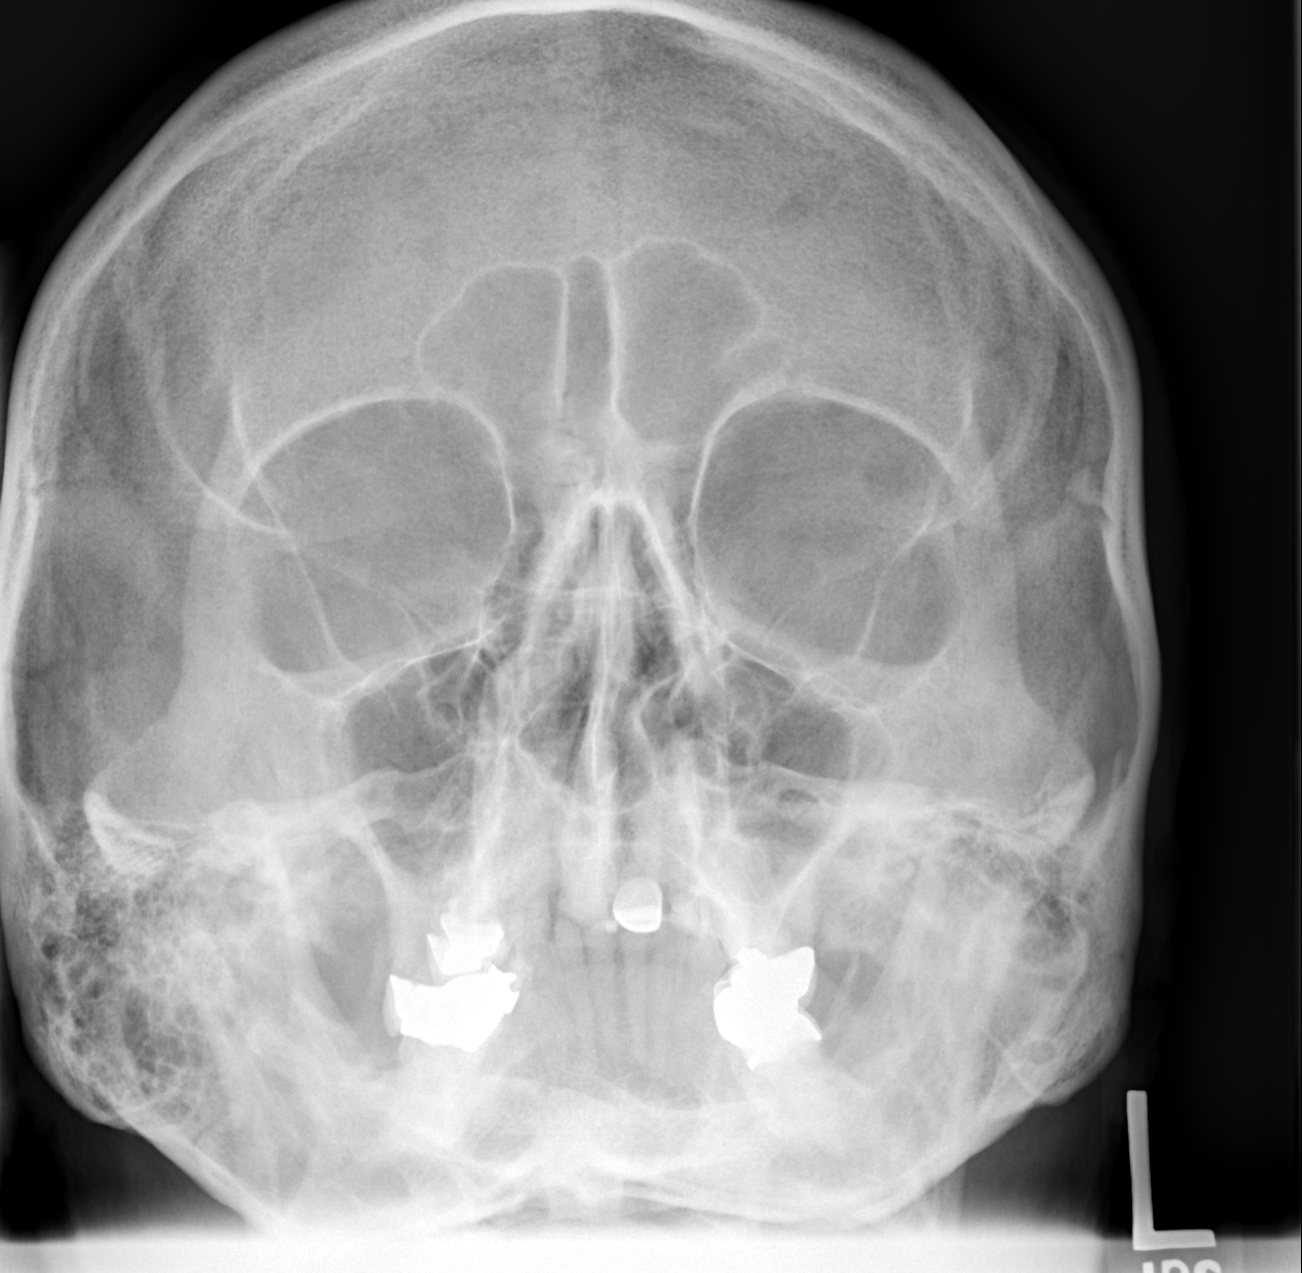

[w waters (2 of 2)]
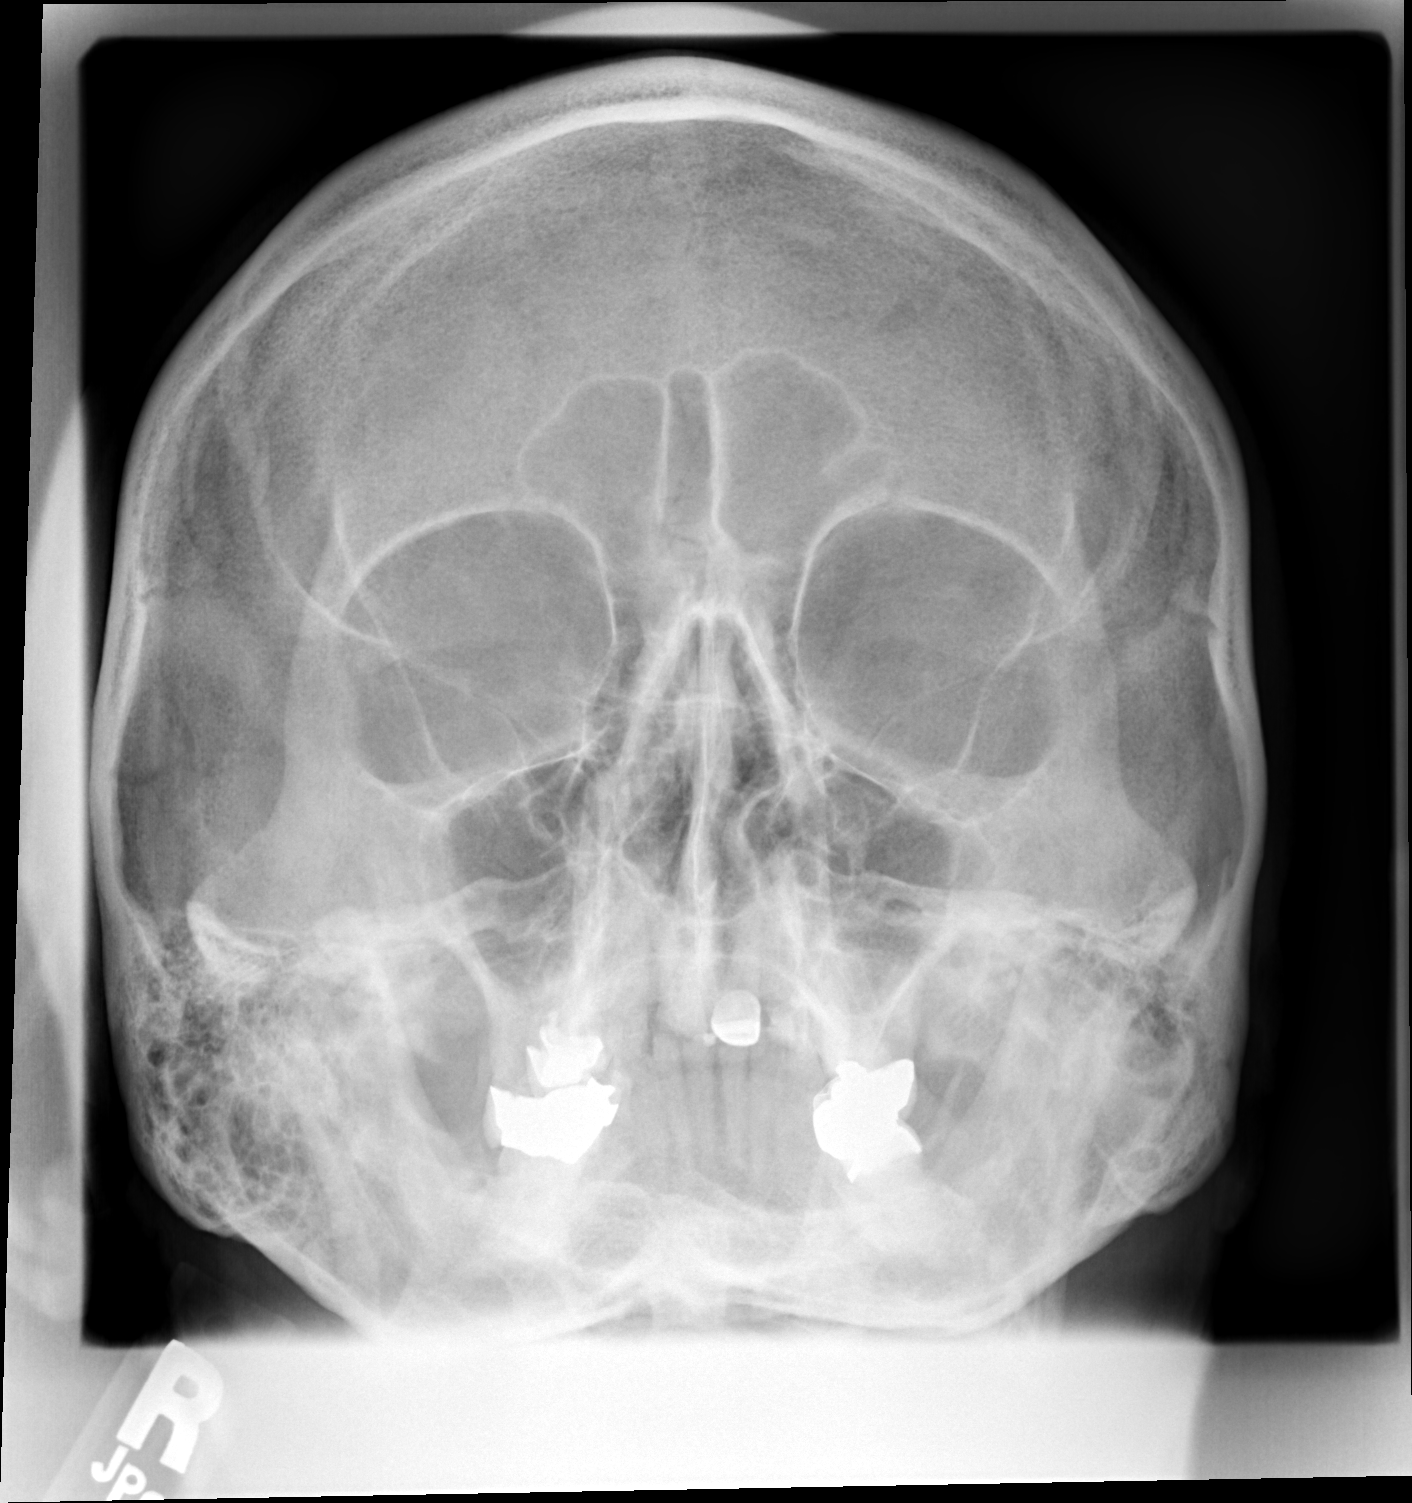

[2 of 2 positions shown; findings below may reference images not displayed]

FINDINGS: No radiodense foreign bodies within the orbits.
IMPRESSION: Patient cleared for MRI

## 2012-01-26 ENCOUNTER — Other Ambulatory Visit: Payer: Self-pay | Admitting: *Deleted

## 2012-02-09 ENCOUNTER — Other Ambulatory Visit: Payer: Self-pay | Admitting: Radiology

## 2012-03-06 ENCOUNTER — Telehealth: Payer: Self-pay

## 2012-03-06 NOTE — Telephone Encounter (Signed)
Called patient, he is due for follow up. He is advised.

## 2012-03-06 NOTE — Telephone Encounter (Signed)
PT STATES HE HAVE BEEN TRYING TO GET HIS TRAMADOL REFILLED AND THE PHARMACY TOLD HIM TO CALL us. I ADVISED PT HE MAY HAVE TO COME IN FOR AN OV BUT HE WANTED HIS MEDS REFILLED FIRST. PLEASE CALL [phone]  [facility] [address]

## 2012-06-27 ENCOUNTER — Ambulatory Visit: Payer: Self-pay | Admitting: Physician Assistant

## 2012-06-27 VITALS — BP 136/84 | HR 80 | Temp 98.2°F | Resp 17 | Ht 66.5 in | Wt 168.0 lb

## 2012-06-27 DIAGNOSIS — M545 Low back pain: Secondary | ICD-10-CM

## 2012-06-27 MED ORDER — TRAMADOL HCL 50 MG PO TABS: 50.0000 mg | ORAL_TABLET | Freq: Four times a day (QID) | ORAL | Status: DC | PRN

## 2012-06-27 MED ORDER — GABAPENTIN 300 MG PO CAPS: 300.0000 mg | ORAL_CAPSULE | Freq: Three times a day (TID) | ORAL | Status: DC

## 2012-06-27 MED ORDER — TRAMADOL HCL 50 MG PO TABS: 50.0000 mg | ORAL_TABLET | Freq: Three times a day (TID) | ORAL | Status: DC | PRN

## 2012-06-27 MED ORDER — METHOCARBAMOL 500 MG PO TABS: 500.0000 mg | ORAL_TABLET | Freq: Three times a day (TID) | ORAL | Status: DC | PRN

## 2012-06-27 NOTE — Patient Instructions (Signed)
Red yeast rice for cholesterol Fish oil for cholesterol

## 2012-06-27 NOTE — Progress Notes (Signed)
   [address], [place] [address]   Phone [phone]  Subjective:    Patient ID: Travis Weber, male    DOB: 04-09-1956, 57 y.o.   MRN: [id number]  HPI Pt presents to clinic for med refill.  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  It is aching and makes him have to sit down.  He is able to take Ultram and work for about 6-8 hours.  He tried the Flexeril for muscle spasms and it really made him groggy and unable to think in the am if he takes the medication.  He did feel like it helped him sleep.  He takes gabapentin irregularly for neck pain.   His pain is lumbar in origin and will sometimes radiate into his left leg but this does not happen regularly.  He has h/o elevated cholesterol and has ben on Pravachol in the past but he cannot afford the labwork that is associated with taking these medications.  [facility] and MRI - arthritis and disc decay per patient - not a surgery problem.   I reviewed his MRI from 2011 and it showed multiple disc and some encroachment on the left nerve at L5.   Review of Systems     Objective:   Physical Exam  Vitals reviewed. Constitutional: He is oriented to person, place, and time. He appears well-developed and well-nourished.  HENT:  Head: Normocephalic and atraumatic.  Right Ear: External ear normal.  Left Ear: External ear normal.  Eyes: Conjunctivae are normal. Pupils are equal, round, and reactive to light.  Neck: Neck supple.  Pulmonary/Chest: Effort normal.  Musculoskeletal:       Lumbar back: He exhibits normal range of motion, no tenderness, no bony tenderness, no swelling, no edema, no pain and no spasm.  Neurological: He is alert and oriented to person, place, and time. He displays normal reflexes.  Skin: Skin is warm and dry.  Psychiatric: He has a normal mood and affect. His behavior is normal. Judgment and thought content normal.         Assessment & Plan:  Low back pain - This is a chronic non surgical problem and patient being on chronic pain medications allows him to continue working.  I d/w pt that if we can find a muscle relaxer that he can take it will be helpful from the resulting muscle spasms form his arthritis and type of work.  Plan: methocarbamol (ROBAXIN) 500 MG tablet, gabapentin (NEURONTIN) 300 MG capsule, DISCONTINUED: traMADol (ULTRAM) 50 MG tablet.  I refilled his meds and will have him recheck yearly.  We can refill his ultram for a year.  Pt takes it 2-4 a day and some days nothing.  Hopefully the medication will last longer than a month.  When pt gets money it would be helpful to check his cholesterol but for now patient will try red yeast rice and continue healthy lifestyle as best he can.  He understands and agrees with the above plan.

## 2012-08-15 ENCOUNTER — Ambulatory Visit: Payer: Self-pay | Admitting: Family Medicine

## 2012-08-15 VITALS — BP 158/90 | HR 67 | Temp 97.7°F | Resp 18 | Ht 67.0 in | Wt 173.0 lb

## 2012-08-15 DIAGNOSIS — Z0289 Encounter for other administrative examinations: Secondary | ICD-10-CM

## 2012-08-15 DIAGNOSIS — I10 Essential (primary) hypertension: Secondary | ICD-10-CM

## 2012-08-15 DIAGNOSIS — Z024 Encounter for examination for driving license: Secondary | ICD-10-CM

## 2012-08-15 NOTE — Progress Notes (Signed)
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 He has no acute complaints today. He says he has been healthy.  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 However he maintains his card and in case he needs a job. Denies substance abuse or alcohol excess.  Review of systems: HEENT unremarkable Cardiovascular: Unremarkable Respiratory: Unremarkable Neurologic: Unremarkable GI: Unremarkable GU: Unremarkable Endocrinologic: Unremarkable Psychiatric: Unremarkable Muscular skeletal: Unremarkable   Physical exam: Pressure 150/94. Will repeat. Generally well-developed well-nourished man in no acute distress. TMs normal. Eyes PERRLA. Fundi benign. Throat clear. Has had a good deal of dental work done in the past, but he says long time since he said this. No carotid bruits. Chest clear to auscultation. Heart regular without murmurs gallops or arrhythmias. Abdomen soft mass or tenderness. Normal external genitalia with testes descended. No hernias. Skin unremarkable. No skeletal unremarkable. Spine normal.  Assessment: DOT physical  Plan: Will repeat blood pressure to decide if he can get a full card or just a three-month card  150/88  Check BP one more time. It was down to 138/88 so can give him a two-year current. However cautioned him of the need to get this under consistent control.

## 2012-09-02 ENCOUNTER — Encounter: Payer: Self-pay | Admitting: Family Medicine

## 2012-12-31 ENCOUNTER — Other Ambulatory Visit: Payer: Self-pay | Admitting: Physician Assistant

## 2013-01-01 NOTE — Telephone Encounter (Signed)
Ready to pick up.  

## 2013-01-02 ENCOUNTER — Other Ambulatory Visit: Payer: Self-pay | Admitting: Physician Assistant

## 2013-01-02 NOTE — Telephone Encounter (Signed)
faxed

## 2013-01-30 ENCOUNTER — Other Ambulatory Visit: Payer: Self-pay | Admitting: Physician Assistant

## 2013-02-06 ENCOUNTER — Other Ambulatory Visit: Payer: Self-pay | Admitting: Radiology

## 2013-02-06 MED ORDER — GABAPENTIN 300 MG PO CAPS: 300.0000 mg | ORAL_CAPSULE | Freq: Three times a day (TID) | ORAL | Status: DC

## 2013-02-06 NOTE — Telephone Encounter (Signed)
Sent meds to the pharmacy.  I sent 6 months of the neurontin.

## 2013-03-14 ENCOUNTER — Other Ambulatory Visit: Payer: Self-pay | Admitting: Physician Assistant

## 2013-03-15 NOTE — Telephone Encounter (Signed)
Ready

## 2013-03-18 ENCOUNTER — Other Ambulatory Visit: Payer: Self-pay | Admitting: Physician Assistant

## 2013-03-19 ENCOUNTER — Other Ambulatory Visit: Payer: Self-pay | Admitting: Physician Assistant

## 2013-03-19 ENCOUNTER — Other Ambulatory Visit: Payer: Self-pay | Admitting: Radiology

## 2013-03-21 ENCOUNTER — Other Ambulatory Visit: Payer: Self-pay | Admitting: Physician Assistant

## 2013-03-22 ENCOUNTER — Other Ambulatory Visit: Payer: Self-pay | Admitting: Physician Assistant

## 2013-04-24 ENCOUNTER — Other Ambulatory Visit: Payer: Self-pay

## 2013-04-24 MED ORDER — GABAPENTIN 300 MG PO CAPS: ORAL_CAPSULE | ORAL | Status: DC

## 2013-04-24 MED ORDER — TRAMADOL HCL 50 MG PO TABS: ORAL_TABLET | ORAL | Status: DC

## 2013-04-24 NOTE — Telephone Encounter (Signed)
Ed from [facility] in [place] [employer], states that pt is requesting his rx for gabapentin and tramadol to now be refilled at [facility] instead of [facility]. Best # for pharmacy: [phone]

## 2013-04-24 NOTE — Telephone Encounter (Signed)
Sent to pharmacy 

## 2013-04-24 NOTE — Telephone Encounter (Signed)
Sent remaining refills for gabapentin, please advise on the tramadol pended

## 2013-04-24 NOTE — Telephone Encounter (Signed)
Called in.

## 2013-07-30 ENCOUNTER — Other Ambulatory Visit: Payer: Self-pay | Admitting: Physician Assistant

## 2013-08-20 ENCOUNTER — Ambulatory Visit: Payer: Self-pay | Admitting: Physician Assistant

## 2013-08-20 VITALS — BP 126/84 | HR 86 | Temp 98.1°F | Resp 16 | Ht 65.5 in | Wt 159.8 lb

## 2013-08-20 DIAGNOSIS — M129 Arthropathy, unspecified: Secondary | ICD-10-CM

## 2013-08-20 DIAGNOSIS — M1990 Unspecified osteoarthritis, unspecified site: Secondary | ICD-10-CM

## 2013-08-20 DIAGNOSIS — M542 Cervicalgia: Secondary | ICD-10-CM

## 2013-08-20 DIAGNOSIS — M549 Dorsalgia, unspecified: Secondary | ICD-10-CM

## 2013-08-20 DIAGNOSIS — G5600 Carpal tunnel syndrome, unspecified upper limb: Secondary | ICD-10-CM

## 2013-08-20 MED ORDER — TRAMADOL HCL 50 MG PO TABS: ORAL_TABLET | ORAL | Status: DC

## 2013-08-20 MED ORDER — CYCLOBENZAPRINE HCL 10 MG PO TABS: 10.0000 mg | ORAL_TABLET | Freq: Three times a day (TID) | ORAL | Status: DC | PRN

## 2013-08-20 MED ORDER — GABAPENTIN 300 MG PO CAPS: 300.0000 mg | ORAL_CAPSULE | Freq: Three times a day (TID) | ORAL | Status: DC

## 2013-08-20 NOTE — Progress Notes (Signed)
   Subjective:    Patient ID: Travis Weber, male    DOB: [birth year], 58 y.o.   MRN: [id number]  HPI   Travis Weber is a pleasant 58 yr old male here for medication refills.  He complains of arthritis in his hands, neck, and back.  He also has carpal tunnel in both wrists.  He takes gabapentin for the carpal tunnel - this relieves his nighttime symptoms, though not much relief during the day.  He also takes  Tramadol TID prn for back and neck pain.  He is interested in changing muscle relaxers - from robaxin to flexeril.  He has used flexeril with success in the past.  He reports difficulty sleeping lately - thinks flexeril may help with this.  Reports he's had a "brutal cold" for the last 3 wks.  Not sure if this may be allergies - not sure how to tell the difference.  Has had allergy problems in the past.  Symptoms include sinus congestion, coughing.  No facial pain, no fever.  Has used Benadryl and Sudafed but not consistently.    Smokes 1ppd - "I don't handle stress very well."  Has quit in the past, would like to quit again.  Previously a pt of [facility].  Last CPE 5-6 yrs ago.  Interested in lab work, Social research officer, government but is uninsured and this is cost prohibitive.   Review of Systems  Constitutional: Negative for fever and chills.  HENT: Positive for congestion and sinus pressure.   Respiratory: Positive for cough. Negative for shortness of breath and wheezing.   Cardiovascular: Negative.   Gastrointestinal: Negative.   Musculoskeletal: Positive for arthralgias, back pain and neck pain.  Skin: Negative.        Objective:   Physical Exam  Vitals reviewed. Constitutional: He is oriented to person, place, and time. He appears well-developed and well-nourished. No distress.  HENT:  Head: Normocephalic and atraumatic.  Right Ear: Tympanic membrane and ear canal normal.  Left Ear: Tympanic membrane and ear canal normal.  Nose: Nose normal.  Mouth/Throat: Uvula is midline, oropharynx is clear and  moist and mucous membranes are normal.  Eyes: Conjunctivae are normal. No scleral icterus.  Neck: Neck supple.  Cardiovascular: Normal rate, regular rhythm and normal heart sounds.   Pulmonary/Chest: Effort normal and breath sounds normal. He has no wheezes. He has no rales.  Lymphadenopathy:    He has no cervical adenopathy.  Neurological: He is alert and oriented to person, place, and time.  Skin: Skin is warm and dry.  Psychiatric: He has a normal mood and affect. His behavior is normal.       Assessment & Plan:  Arthritis - Plan: traMADol (ULTRAM) 50 MG tablet  Carpal tunnel syndrome - Plan: gabapentin (NEURONTIN) 300 MG capsule  Neck pain - Plan: traMADol (ULTRAM) 50 MG tablet, cyclobenzaprine (FLEXERIL) 10 MG tablet  Back pain - Plan: traMADol (ULTRAM) 50 MG tablet, cyclobenzaprine (FLEXERIL) 10 MG tablet   Travis Weber is a pleasant 58 yr old male here for medication refills.  His pain has been well controlled on gabapentin and tramadol.  Will continue these medications.  Change robaxin to flexeril.  Can refill meds x 6 months  Encouraged pt to schedule CPE in about 6 months for routine health maintenance/screening.  Pt understands and agrees    E. Natividad Brood MHS, PA-C [facility] 5/4/20155:15 PM

## 2013-08-20 NOTE — Patient Instructions (Signed)
Continue taking the gabapentin as directed  Take tramadol if needed  Take flexeril if needed - may be sedating  Try over the counter cetirizine (Zyrtec) once daily and Nasacort OR Flonase once daily for your nasal symptoms  Consider quitting smoking :)  Plan to do a complete physical sometime in the next 6 months

## 2013-08-22 NOTE — Progress Notes (Signed)
There are no valid phone numbers on file for this patient.

## 2013-09-13 ENCOUNTER — Other Ambulatory Visit: Payer: Self-pay

## 2013-09-13 DIAGNOSIS — M1990 Unspecified osteoarthritis, unspecified site: Secondary | ICD-10-CM

## 2013-09-13 DIAGNOSIS — M542 Cervicalgia: Secondary | ICD-10-CM

## 2013-09-13 DIAGNOSIS — M549 Dorsalgia, unspecified: Secondary | ICD-10-CM

## 2013-09-13 MED ORDER — TRAMADOL HCL 50 MG PO TABS: ORAL_TABLET | ORAL | Status: DC

## 2013-09-13 NOTE — Telephone Encounter (Signed)
Faxed

## 2013-09-13 NOTE — Telephone Encounter (Signed)
Pharm reqs RF of tramadol. 

## 2013-10-05 ENCOUNTER — Other Ambulatory Visit: Payer: Self-pay | Admitting: Physician Assistant

## 2013-10-07 NOTE — Telephone Encounter (Signed)
Please call the patient we switched him to [facility] but the pharmacy is requesting Robaxin -please get details.

## 2013-10-11 ENCOUNTER — Other Ambulatory Visit: Payer: Self-pay

## 2013-10-11 DIAGNOSIS — M1990 Unspecified osteoarthritis, unspecified site: Secondary | ICD-10-CM

## 2013-10-11 DIAGNOSIS — M542 Cervicalgia: Secondary | ICD-10-CM

## 2013-10-11 NOTE — Telephone Encounter (Signed)
Pharm reqs RF of tramadol. 

## 2013-10-12 MED ORDER — TRAMADOL HCL 50 MG PO TABS: ORAL_TABLET | ORAL | Status: DC

## 2013-10-13 NOTE — Telephone Encounter (Signed)
Faxed

## 2013-10-24 ENCOUNTER — Other Ambulatory Visit: Payer: Self-pay | Admitting: Physician Assistant

## 2013-11-15 ENCOUNTER — Telehealth: Payer: Self-pay | Admitting: Family Medicine

## 2013-11-15 DIAGNOSIS — M1990 Unspecified osteoarthritis, unspecified site: Secondary | ICD-10-CM

## 2013-11-15 DIAGNOSIS — M542 Cervicalgia: Secondary | ICD-10-CM

## 2013-11-15 MED ORDER — TRAMADOL HCL 50 MG PO TABS: ORAL_TABLET | ORAL | Status: DC

## 2013-11-15 NOTE — Telephone Encounter (Signed)
Rx printed. Per Ms. Travis Weber last note, he needs to [facility] for follow-up and physical in 02/2014.  Meds ordered this encounter  Medications  . traMADol (ULTRAM) 50 MG tablet    Sig: TAKE ONE TABLET BY MOUTH EVERY 8 HOURS AS NEEDED    Dispense:  90 tablet    Refill:  0    Order Specific Question:  Supervising Provider    Answer:  DOOLITTLE, ROBERT P [[id number]]

## 2013-11-15 NOTE — Telephone Encounter (Signed)
Faxed Rx w/note for pharm to remind pt due for CPE in 02/2014.

## 2013-11-15 NOTE — Telephone Encounter (Signed)
Josh from [facility] called to request refill on Tramadol. He tried sending electronically and fax but neither went through. Please advise

## 2013-11-28 ENCOUNTER — Other Ambulatory Visit: Payer: Self-pay | Admitting: Physician Assistant

## 2013-11-30 ENCOUNTER — Other Ambulatory Visit: Payer: Self-pay | Admitting: Physician Assistant

## 2013-12-10 ENCOUNTER — Ambulatory Visit (INDEPENDENT_AMBULATORY_CARE_PROVIDER_SITE_OTHER): Payer: Self-pay | Admitting: Emergency Medicine

## 2013-12-10 VITALS — BP 122/72 | HR 91 | Temp 98.3°F | Resp 17 | Ht 66.0 in | Wt 160.0 lb

## 2013-12-10 DIAGNOSIS — M1990 Unspecified osteoarthritis, unspecified site: Secondary | ICD-10-CM

## 2013-12-10 DIAGNOSIS — M542 Cervicalgia: Secondary | ICD-10-CM

## 2013-12-10 DIAGNOSIS — M129 Arthropathy, unspecified: Secondary | ICD-10-CM

## 2013-12-10 MED ORDER — TRAMADOL HCL 50 MG PO TABS: 50.0000 mg | ORAL_TABLET | Freq: Four times a day (QID) | ORAL | Status: DC

## 2013-12-10 MED ORDER — GABAPENTIN 300 MG PO CAPS: 300.0000 mg | ORAL_CAPSULE | Freq: Three times a day (TID) | ORAL | Status: DC

## 2013-12-10 NOTE — Progress Notes (Signed)
   Subjective:    Patient ID: Travis Weber, male    DOB: 1956-02-27, 58 y.o.   MRN: [id number] This chart was scribed for Arlyss Queen, MD by Rosary Lively, ED scribe. This patient was seen in room Room/bed 08 and the patient's care was started at 9:16 AM.   Chief Complaint  Patient presents with  . Medication Refill    ultram,neurontin    HPI  HPI Comments:  Travis Weber is a 58 y.o. male who presents to [facility] for a prescription refill. Pt reports that he has been taking gabapentin and 4 tramadol in a 24 hour period for back pain, neuropathy, and carpel tunnel. Pt reports that he cannot take tramadol late in the evening because it interferes with his sleep. Pt reports visiting an orthopedist, however, he no longer has insurance. 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 He states that the more that he is on his feet, the more pain he experiences. Pt reports that his lower back gives him the most trouble, and that the pain is exacerbated with physical activity such as hammering. Pt reports that he has previously received a shot of cortisone for his back pain, that provided relief. Pt reports that he has not visited the office in approximately 8 months, but has not had a physical recently. He reports that he had a colonoscopy 4 years ago. He states that his family has a h/o high cholesterol.    Review of Systems  Musculoskeletal: Positive for arthralgias, back pain and myalgias.       Objective:   Physical Exam  Nursing note and vitals reviewed. Constitutional: He is oriented to person, place, and time. He appears well-developed and well-nourished.  HENT:  Head: Normocephalic and atraumatic.  Eyes: EOM are normal.  Neck: Normal range of motion. Neck supple.  Cardiovascular: Normal rate.   Pulmonary/Chest: Effort normal.  Musculoskeletal: Normal range of motion.  Neurological: He is alert and oriented to person, place, and time. No cranial nerve  deficit. He exhibits normal muscle tone. Coordination normal.  Skin: Skin is warm and dry.  Scar over the volar surface of the right wrist.  Psychiatric: He has a normal mood and affect. His behavior is normal.          Assessment & Plan:  I did refill his medications. He can take the Neurontin and Ultram. We'll recheck in 6 months . I personally performed the services described in this documentation, which was scribed in my presence. The recorded information has been reviewed and is accurate.

## 2014-02-27 ENCOUNTER — Ambulatory Visit (INDEPENDENT_AMBULATORY_CARE_PROVIDER_SITE_OTHER): Payer: Self-pay | Admitting: Emergency Medicine

## 2014-02-27 VITALS — BP 124/80 | HR 81 | Temp 98.4°F | Resp 17 | Ht 65.5 in | Wt 161.0 lb

## 2014-02-27 DIAGNOSIS — M25461 Effusion, right knee: Secondary | ICD-10-CM

## 2014-02-27 DIAGNOSIS — M7711 Lateral epicondylitis, right elbow: Secondary | ICD-10-CM

## 2014-02-27 MED ORDER — PREDNISONE 10 MG PO KIT: PACK | ORAL | Status: DC

## 2014-02-27 NOTE — Patient Instructions (Signed)
Tennis Elbow Your caregiver has diagnosed you with a condition often referred to as "tennis elbow." This results from small tears or soreness (inflammation) at the start (origin) of the extensor muscles of the forearm. Although the condition is often called tennis or golfer's elbow, it is caused by any repetitive action performed by your elbow. HOME CARE INSTRUCTIONS  If the condition has been short lived, rest may be the only treatment required. Using your opposite hand or arm to perform the task may help. Even changing your grip may help rest the extremity. These may even prevent the condition from recurring.  Longer standing problems, however, will often be relieved faster by:  Using anti-inflammatory agents.  Applying ice packs for 30 minutes at the end of the working day, at bed time, or when activities are finished.  Your caregiver may also have you wear a splint or sling. This will allow the inflamed tendon to heal. At times, steroid injections aided with a local anesthetic will be required along with splinting for 1 to 2 weeks. Two to three steroid injections will often solve the problem. In some long standing cases, the inflamed tendon does not respond to conservative (non-surgical) therapy. Then surgery may be required to repair it. MAKE SURE YOU:   Understand these instructions.  Will watch your condition.  Will get help right away if you are not doing well or get worse. Document Released: 04/05/2005 Document Revised: 06/28/2011 Document Reviewed: 11/22/2007 Exit[employer] Patient Information 2015 [employer]. This information is not intended to replace advice given to you by your health care provider. Make sure you discuss any questions you have with your health care provider.  

## 2014-02-27 NOTE — Progress Notes (Signed)
[facility] [address] [place] [address] [phone]  Date:  02/27/2014   Name:  Travis Weber   DOB:  06-14-1955   MRN:  [id number]  PCP:  Mack Hook, MD    Chief Complaint: Knee Pain and Elbow Pain   History of Present Illness:  Travis Weber is a 58 y.o. very pleasant male patient who presents with the following:  Patient works as a Programmer, applications.  Has pain in right elbow related to repetive injury.  No direct injury Pain worse with hammering. Pain for months in the right knee associated with swelling.  No history of injury.  Clicks. No locking.  No redness No improvement with over the counter medications or other home remedies.  Denies other complaint or health concern today.   Patient Active Problem List   Diagnosis Date Noted  . THYROMEGALY 03/10/2010  . CHEST PAIN 03/10/2010  . DEGENERATIVE DISC DISEASE, LUMBOSACRAL SPINE W/RADICULOPATHY 12/04/2009  . ALCOHOL ABUSE, EPISODIC 03/07/2009  . HYPERGLYCEMIA 03/02/2008  . DYSHIDROTIC ECZEMA, 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  . TRIGGER FINGER, LEFT THUMB 04/17/2007  . CARPAL TUNNEL SYNDROME, BILATERAL 02/10/2007  . HYPERLIPIDEMIA 02/01/2007  . LOW BACK PAIN SYNDROME 02/01/2007    Past Medical History  Diagnosis Date  . Arthritis   . Neuromuscular disorder     Past Surgical History  Procedure Laterality Date  . Appendectomy      History  Substance Use Topics  . Smoking status: Current Every Day Smoker -- 1.00 packs/day for 40 years    Types: Cigarettes  . Smokeless tobacco: Not on file  . Alcohol Use: 0.0 oz/week    0 Not specified per week    Family History  Problem Relation Age of Onset  . Heart disease Sister   . Hyperlipidemia Sister   . Hypertension Sister   . Hypertension Brother   . Hypertension Brother   . Hypertension Sister     No Known Allergies  Medication list has been reviewed and updated.  Current Outpatient Prescriptions on File Prior to Visit  Medication Sig  Dispense Refill  . gabapentin (NEURONTIN) 300 MG capsule Take 1 capsule (300 mg total) by mouth 3 (three) times daily. 120 capsule 5  . traMADol (ULTRAM) 50 MG tablet Take 1 tablet (50 mg total) by mouth 4 (four) times daily. TAKE ONE TABLET BY MOUTH EVERY 8 HOURS AS NEEDED 120 tablet 5  . cyclobenzaprine (FLEXERIL) 10 MG tablet Take 1 tablet (10 mg total) by mouth 3 (three) times daily as needed for muscle spasms. 60 tablet 1   No current facility-administered medications on file prior to visit.    Review of Systems:  As per HPI, otherwise negative.    Physical Examination: Filed Vitals:   02/27/14 0930  BP: 124/80  Pulse: 81  Temp: 98.4 F (36.9 C)  Resp: 17   Filed Vitals:   02/27/14 0930  Height: 5' 5.5" (1.664 m)  Weight: 161 lb (73.029 kg)   Body mass index is 26.37 kg/(m^2). Ideal Body Weight: Weight in (lb) to have BMI = 25: 152.2   GEN: WDWN, NAD, Non-toxic, Alert & Oriented x 3 HEENT: Atraumatic, Normocephalic.  Ears and Nose: No external deformity. EXTR: No clubbing/cyanosis/edema NEURO: Normal gait.  PSYCH: Normally interactive. Conversant. Not depressed or anxious appearing.  Calm demeanor.  Tender right lateral epicondyle which reproduces his elbow pain. Right knee swelling and effusion. Full ROM.  Joint stable   Assessment and Plan: Tennis elbow Osteoarthritis knee sterapred  Continue home meds  Refused to consider xray or mri Signed,  Ellison Carwin, MD

## 2014-06-13 ENCOUNTER — Other Ambulatory Visit: Payer: Self-pay | Admitting: Physician Assistant

## 2014-06-14 NOTE — Telephone Encounter (Signed)
Called in.

## 2014-07-05 ENCOUNTER — Ambulatory Visit: Payer: Self-pay

## 2014-07-05 ENCOUNTER — Ambulatory Visit (INDEPENDENT_AMBULATORY_CARE_PROVIDER_SITE_OTHER): Payer: Self-pay | Admitting: Family Medicine

## 2014-07-05 VITALS — BP 136/88 | HR 78 | Temp 97.6°F | Resp 17 | Ht 66.0 in | Wt 158.2 lb

## 2014-07-05 DIAGNOSIS — G5602 Carpal tunnel syndrome, left upper limb: Secondary | ICD-10-CM

## 2014-07-05 DIAGNOSIS — G5603 Carpal tunnel syndrome, bilateral upper limbs: Secondary | ICD-10-CM

## 2014-07-05 DIAGNOSIS — Z760 Encounter for issue of repeat prescription: Secondary | ICD-10-CM

## 2014-07-05 DIAGNOSIS — Z0289 Encounter for other administrative examinations: Secondary | ICD-10-CM

## 2014-07-05 DIAGNOSIS — G5601 Carpal tunnel syndrome, right upper limb: Secondary | ICD-10-CM

## 2014-07-05 MED ORDER — GABAPENTIN 300 MG PO CAPS: 300.0000 mg | ORAL_CAPSULE | Freq: Four times a day (QID) | ORAL | Status: DC

## 2014-07-05 NOTE — Progress Notes (Signed)
Subjective:    Patient ID: Travis Weber, male    DOB: [birth year], 59 y.o.   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 He does not have a PCP; he has carpel tunnel syndrome associated with use of hands with some aspects of driving. Gabapentin has been prescribed and is effective when taken 4 times a day. Pt requests refill on this medication.  Patient Active Problem List   Diagnosis Date Noted  . THYROMEGALY 03/10/2010  . CHEST PAIN 03/10/2010  . DEGENERATIVE DISC DISEASE, LUMBOSACRAL SPINE W/RADICULOPATHY 12/04/2009  . ALCOHOL ABUSE, EPISODIC 03/07/2009  . HYPERGLYCEMIA 03/02/2008  . DYSHIDROTIC ECZEMA, 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  . TRIGGER FINGER, LEFT THUMB 04/17/2007  . CARPAL TUNNEL SYNDROME, BILATERAL 02/10/2007  . HYPERLIPIDEMIA 02/01/2007  . LOW BACK PAIN SYNDROME 02/01/2007    Prior to Admission medications   Medication Sig Start Date End Date Taking? Authorizing Provider  cyclobenzaprine (FLEXERIL) 10 MG tablet Take 1 tablet (10 mg total) by mouth 3 (three) times daily as needed for muscle spasms. 08/20/13  Yes Eleanore E Elana Alm, PA-C  gabapentin (NEURONTIN) 300 MG capsule Take 1 capsule (300 mg total) by mouth 3 (three) times daily. 12/10/13  Yes Darlyne Russian, MD  traMADol (ULTRAM) 50 MG tablet TAKE 1 TABLET BY MOUTH EVERY 6 HOURS AS NEEDED 06/14/14  Yes Darlyne Russian, MD  PredniSONE 10 MG KIT As directed on package Patient not taking: Reported on 07/05/2014 02/27/14   Roselee Culver, MD    Past Surgical History  Procedure Laterality Date  . Appendectomy      SOC and FAM HX reviewed.   Review of Systems  Constitutional: Negative.   HENT: Positive for dental problem.   Eyes:       Wears corrective lenses.  Respiratory: Negative.   Cardiovascular: Negative.   Gastrointestinal: Negative.   Endocrine: Negative.   Musculoskeletal: Positive for arthralgias. Negative for  myalgias, back pain, joint swelling and gait problem.  Skin: Negative.   Neurological: Positive for numbness. Negative for dizziness, syncope, facial asymmetry, speech difficulty, weakness and headaches.       Bilateral carpel tunnel syndrome- symptoms are intermittent.  Psychiatric/Behavioral: Negative.       Objective:   Physical Exam  Constitutional: He is oriented to person, place, and time. Vital signs are normal. He appears well-developed and well-nourished. No distress.  Blood pressure 136/88, pulse 78, temperature 97.6 F (36.4 C), temperature source Oral, resp. rate 17, height _0  (1.676 m), weight 158 lb 3.2 oz (71.759 kg), SpO2 97 %.    HENT:  Head: Normocephalic and atraumatic.  Right Ear: Hearing, external ear and ear canal normal. Tympanic membrane is scarred.  Left Ear: Hearing, external ear and ear canal normal. Tympanic membrane is scarred.  Nose: Nose normal. No nasal deformity or septal deviation.  Mouth/Throat: Mucous membranes are normal. No oral lesions. Abnormal dentition. No uvula swelling. Posterior oropharyngeal erythema present.  Eyes: Conjunctivae, EOM and lids are normal. Pupils are equal, round, and reactive to light. No scleral icterus.  Wears corrective lenses.  Neck: Trachea normal, full passive range of motion without pain and phonation normal. Neck supple. No spinous process tenderness and no muscular tenderness present. Carotid bruit is not present. Decreased range of motion present. No thyroid mass and no thyromegaly present.  Cardiovascular: Normal rate, regular rhythm, S1 normal, S2 normal, normal heart sounds and normal pulses.  No extrasystoles are present. PMI is not displaced.  Exam reveals no gallop and no friction rub.   No murmur heard. Pulmonary/Chest: Effort normal and breath sounds normal. No respiratory distress. He has no wheezes. He has no rhonchi.  Abdominal: Soft. Normal appearance and bowel sounds are normal. He exhibits no  distension, no pulsatile midline mass and no mass. There is no hepatosplenomegaly. There is no tenderness. There is no guarding and no CVA tenderness.  Musculoskeletal: He exhibits no edema.       Cervical back: Normal.       Thoracic back: Normal.       Lumbar back: Normal.       Right hand: He exhibits deformity. He exhibits no tenderness and no swelling. Normal sensation noted. Normal strength noted.       Left hand: He exhibits deformity. He exhibits no tenderness and no swelling. Normal sensation noted. Normal strength noted.  Mild degenerative changes in both wrists and digits/hands.  Lymphadenopathy:    He has no cervical adenopathy.  Neurological: He is alert and oriented to person, place, and time. He has normal strength and normal reflexes. He displays no atrophy. No cranial nerve deficit or sensory deficit. He exhibits normal muscle tone. He displays a negative Romberg sign. Coordination and gait normal.  Skin: Skin is warm, dry and intact. No ecchymosis and no rash noted. He is not diaphoretic. No erythema. No pallor.  Psychiatric: He has a normal mood and affect. His speech is normal and behavior is normal. Judgment and thought content normal. Cognition and memory are normal.  Nursing note and vitals reviewed.      Assessment & Plan:  Encounter for occupational history and physical examination- DOT re-certification x 2 years.  Medication refill- Gabapentin 354m 1 capsule 4 times a day.  Bilateral carpal tunnel syndrome  Meds ordered this encounter  Medications  . gabapentin (NEURONTIN) 300 MG capsule    Sig: Take 1 capsule (300 mg total) by mouth 4 (four) times daily.    Dispense:  120 capsule    Refill:  5

## 2014-07-05 NOTE — Patient Instructions (Signed)
As per our conversation, it would be a good idea for you to get established here or with a primary physician of your choices so that preventative care/ health care be maintained; this will enable you to have new and ongoing health issues addressed.

## 2014-11-05 ENCOUNTER — Other Ambulatory Visit: Payer: Self-pay

## 2014-11-05 NOTE — Telephone Encounter (Signed)
Pharm reqs RF of tramadol. Dr Everlene Farrier do you want to RF, or do you need to see pt back first? Looks like pt was here in March and discussed pain, but it was for a DOT physical.

## 2014-11-07 MED ORDER — TRAMADOL HCL 50 MG PO TABS: 50.0000 mg | ORAL_TABLET | Freq: Four times a day (QID) | ORAL | Status: DC | PRN

## 2015-01-01 ENCOUNTER — Other Ambulatory Visit: Payer: Self-pay

## 2015-01-01 MED ORDER — GABAPENTIN 300 MG PO CAPS: 300.0000 mg | ORAL_CAPSULE | Freq: Four times a day (QID) | ORAL | Status: DC

## 2015-02-02 ENCOUNTER — Other Ambulatory Visit: Payer: Self-pay | Admitting: Physician Assistant

## 2015-03-05 ENCOUNTER — Other Ambulatory Visit: Payer: Self-pay | Admitting: Physician Assistant

## 2015-03-26 ENCOUNTER — Ambulatory Visit (INDEPENDENT_AMBULATORY_CARE_PROVIDER_SITE_OTHER): Payer: Self-pay | Admitting: Emergency Medicine

## 2015-03-26 VITALS — BP 122/82 | HR 84 | Temp 98.0°F | Resp 16 | Ht 66.5 in | Wt 158.0 lb

## 2015-03-26 DIAGNOSIS — G894 Chronic pain syndrome: Secondary | ICD-10-CM

## 2015-03-26 MED ORDER — TRAMADOL HCL 50 MG PO TABS: 50.0000 mg | ORAL_TABLET | Freq: Four times a day (QID) | ORAL | Status: DC | PRN

## 2015-03-26 MED ORDER — GABAPENTIN 300 MG PO CAPS: ORAL_CAPSULE | ORAL | Status: DC

## 2015-03-26 NOTE — Progress Notes (Signed)
Subjective:  Patient ID: Travis Weber, male    DOB: 02-25-1956  Age: 59 y.o. 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 That he is forced to "do a dance" with the pharmacy about refills. He has no sense that he needs to follow-up for refills. His back pain and neck pain are stable. He is taking tramadol for this pain he denies any acute complaint.  History Travis Weber has a past medical history of Arthritis and Neuromuscular disorder ([place]).   He has past surgical history that includes Appendectomy.   His  family history includes Heart disease in his sister; Hyperlipidemia in his sister; Hypertension in his brother, brother, sister, and sister.  He   reports that he has been smoking Cigarettes.  He has a 40 pack-year smoking history. He does not have any smokeless tobacco history on file. He reports that he drinks alcohol. He reports that he does not use illicit drugs.  Outpatient Prescriptions Prior to Visit  Medication Sig Dispense Refill  . gabapentin (NEURONTIN) 300 MG capsule TAKE 1 CAPSULE (300 MG TOTAL) BY MOUTH 4 (FOUR) TIMES DAILY  "NO MORE REFILLS WITHOUT OFFICE VISIT" 60 capsule 0  . traMADol (ULTRAM) 50 MG tablet Take 1 tablet (50 mg total) by mouth every 6 (six) hours as needed. 120 tablet 4  . cyclobenzaprine (FLEXERIL) 10 MG tablet Take 1 tablet (10 mg total) by mouth 3 (three) times daily as needed for muscle spasms. (Patient not taking: Reported on 03/26/2015) 60 tablet 1   No facility-administered medications prior to visit.    Social History   Social History  . Marital Status: Single    Spouse Name: N/A  . Number of Children: N/A  . Years of Education: N/A   Social History Main Topics  . Smoking status: Current Every Day Smoker -- 1.00 packs/day for 40 years    Types: Cigarettes  . Smokeless tobacco: None  . Alcohol Use: 0.0 oz/week    0 Standard drinks or equivalent per week  . Drug  Use: No  . Sexual Activity: No   Other Topics Concern  . None   Social History Narrative     Review of Systems  Constitutional: Negative for fever, chills and appetite change.  HENT: Negative for congestion, ear pain, postnasal drip, sinus pressure and sore throat.   Eyes: Negative for pain and redness.  Respiratory: Negative for cough, shortness of breath and wheezing.   Cardiovascular: Negative for leg swelling.  Gastrointestinal: Negative for nausea, vomiting, abdominal pain, diarrhea, constipation and blood in stool.  Endocrine: Negative for polyuria.  Genitourinary: Negative for dysuria, urgency, frequency and flank pain.  Musculoskeletal: Positive for back pain and neck pain. Negative for gait problem.  Skin: Negative for rash.  Neurological: Negative for weakness and headaches.  Psychiatric/Behavioral: Negative for confusion and decreased concentration. The patient is not nervous/anxious.     Objective:  BP 122/82 mmHg  Pulse 84  Temp(Src) 98 F (36.7 C) (Oral)  Resp 16  Ht 5' 6.5" (1.689 m)  Wt 158 lb (71.668 kg)  BMI 25.12 kg/m2  SpO2 98%  Physical Exam  Constitutional: He is oriented to person, place, and time. He appears well-developed and well-nourished.  HENT:  Head: Normocephalic and atraumatic.  Eyes: Conjunctivae are normal. Pupils are equal, round, and reactive to light.  Pulmonary/Chest: Effort normal.  Musculoskeletal: He exhibits no edema.  Neurological: He is alert  and oriented to person, place, and time.  Skin: Skin is dry.  Psychiatric: He has a normal mood and affect. His behavior is normal. Thought content normal.      Assessment & Plan:   Travis Weber was seen today for medication refill.  Diagnoses and all orders for this visit:  Chronic pain syndrome  Other orders -     traMADol (ULTRAM) 50 MG tablet; Take 1 tablet (50 mg total) by mouth every 6 (six) hours as needed. -     gabapentin (NEURONTIN) 300 MG capsule; TAKE 1 CAPSULE (300 MG  TOTAL) BY MOUTH 4 (FOUR) TIMES DAILY   I have changed Travis Weber's gabapentin. I am also having him maintain his cyclobenzaprine and traMADol.  Meds ordered this encounter  Medications  . traMADol (ULTRAM) 50 MG tablet    Sig: Take 1 tablet (50 mg total) by mouth every 6 (six) hours as needed.    Dispense:  120 tablet    Refill:  5  . gabapentin (NEURONTIN) 300 MG capsule    Sig: TAKE 1 CAPSULE (300 MG TOTAL) BY MOUTH 4 (FOUR) TIMES DAILY    Dispense:  120 capsule    Refill:  5    No refills available   I explained to him that he really was required to command every 6 month for follow-up of to ensure stability was problem. He R unit about that because he has no insurance I also suggested we send him to a pain management clinic. He was not willing to do that either  Appropriate red flag conditions were discussed with the patient as well as actions that should be taken.  Patient expressed his understanding.  Follow-up: Return in about 6 months (around 09/24/2015).  Roselee Culver, MD

## 2015-03-26 NOTE — Patient Instructions (Signed)

## 2015-09-30 ENCOUNTER — Ambulatory Visit (INDEPENDENT_AMBULATORY_CARE_PROVIDER_SITE_OTHER): Payer: Self-pay | Admitting: Physician Assistant

## 2015-09-30 VITALS — BP 138/73 | HR 79 | Temp 98.3°F | Resp 17 | Ht 66.5 in | Wt 157.0 lb

## 2015-09-30 DIAGNOSIS — M545 Low back pain, unspecified: Secondary | ICD-10-CM

## 2015-09-30 DIAGNOSIS — L298 Other pruritus: Secondary | ICD-10-CM

## 2015-09-30 DIAGNOSIS — G5600 Carpal tunnel syndrome, unspecified upper limb: Secondary | ICD-10-CM

## 2015-09-30 MED ORDER — GABAPENTIN 300 MG PO CAPS: ORAL_CAPSULE | ORAL | Status: DC

## 2015-09-30 MED ORDER — TRAMADOL HCL 50 MG PO TABS: 50.0000 mg | ORAL_TABLET | Freq: Four times a day (QID) | ORAL | Status: DC | PRN

## 2015-09-30 MED ORDER — TRIAMCINOLONE ACETONIDE 0.5 % EX OINT: 1.0000 "application " | TOPICAL_OINTMENT | Freq: Two times a day (BID) | CUTANEOUS | Status: DC

## 2015-09-30 NOTE — Patient Instructions (Signed)
     IF you received an x-ray today, you will receive an invoice from [facility]. Please contact [facility] at [phone] with questions or concerns regarding your invoice.   IF you received labwork today, you will receive an invoice from [facility]. Please contact [facility] at [phone] with questions or concerns regarding your invoice.   Our billing staff will not be able to assist you with questions regarding bills from these companies.  You will be contacted with the lab results as soon as they are available. The fastest way to get your results is to activate your My Chart account. Instructions are located on the last page of this paperwork. If you have not heard from us regarding the results in 2 weeks, please contact this office.      

## 2015-09-30 NOTE — Progress Notes (Signed)
09/30/2015 1:12 PM   DOB: 11-Feb-1956 / MRN: [id number]  SUBJECTIVE:  Travis Weber is a 60 y.o. male presenting for a refill of his medications and rash about his feet which has been blistering.  Reports he has a history of eczema and his feet have been particularly dry and cracker over the last month.  He feels that he is getting worse.  He has tried some triamcinolone cream from a friend and reports this helped the rash.  He has not been sexually active in ten years and denies a history of diabetes.    He would like a refill of his medications today.  He takes tramadol and gabapentin.  The tramadol is for his chronic back pain and he reports it gives him excellent relief.  He does not need this daily.  He is taking gabapentin for a history of carpal tunnel and reports this also gives him excellent relief of pain in his hands.   He is self pay and can not afford labs today.  He understands the risk associated with taking prescription medications and reports to me that once he can get on social security he will come back to the clinic for a full physical.   He has No Known Allergies.   He  has a past medical history of Arthritis and Neuromuscular disorder ([place]).    He  reports that he has been smoking Cigarettes.  He has a 49 pack-year smoking history. He does not have any smokeless tobacco history on file. He reports that he drinks alcohol. He reports that he does not use illicit drugs. He  reports that he does not engage in sexual activity. The patient  has past surgical history that includes Appendectomy.  His family history includes Heart disease in his sister; Hyperlipidemia in his sister; Hypertension in his brother, brother, sister, and sister.  Review of Systems  Constitutional: Negative for fever and chills.  Eyes: Negative for blurred vision.  Respiratory: Negative for cough and shortness of breath.   Cardiovascular: Negative for chest pain.  Gastrointestinal: Negative for nausea  and abdominal pain.  Genitourinary: Negative for dysuria, urgency and frequency.  Musculoskeletal: Negative for myalgias.  Skin: Positive for itching and rash.  Neurological: Negative for dizziness, tingling and headaches.  Psychiatric/Behavioral: Negative for depression. The patient is not nervous/anxious.     Problem list and medications reviewed and updated by myself where necessary, and exist elsewhere in the encounter.   OBJECTIVE:  BP 138/73 mmHg  Pulse 79  Temp(Src) 98.3 F (36.8 C) (Oral)  Resp 17  Ht 5' 6.5" (1.689 m)  Wt 157 lb (71.215 kg)  BMI 24.96 kg/m2  SpO2 99%  Physical Exam  Constitutional: He is oriented to person, place, and time. He appears well-developed. He does not appear ill.  Eyes: Conjunctivae and EOM are normal. Pupils are equal, round, and reactive to light.  Cardiovascular: Normal rate.   Pulmonary/Chest: Effort normal.  Abdominal: He exhibits no distension.  Musculoskeletal: Normal range of motion.       Feet:  Neurological: He is alert and oriented to person, place, and time. No cranial nerve deficit. Coordination normal.  Skin: Skin is warm and dry. He is not diaphoretic.  Psychiatric: He has a normal mood and affect.  Nursing note and vitals reviewed.   No results found for this or any previous visit (from the past 72 hour(s)).  No results found.  ASSESSMENT AND PLAN  Travis Weber was seen today for medication refill  and patient has rash on bottoms of feet.  Travis Weber was seen today for medication refill and patient has rash on bottoms of feet.  Diagnoses and all orders for this visit:  Xerotic eczema: Will try him on triamcinolone since this worked in the recent past.  Advised that he use copious vaseline and occulsive dressing for excessively dry and cracked sckin.   -     Cancel: COMPLETE METABOLIC PANEL WITH GFR  Carpal tunnel syndrome, unspecified laterality: I did advise that we check labs to ensure his liver and kidneys were tolerated  his prescribed medications. When I told him the cost would be 49 dollars he became very upset with me and threw his printed prescription for tramadol on the floor. I picked this up and handed back to him and told him these labs do need to be checked in the near future.  He then tried to argue that he needed a PSA checked as well.  I agreed and did ask that he rtc once he feels he will be able to afford labs.   -     Cancel: COMPLETE METABOLIC PANEL WITH GFR       -     gabapentin (NEURONTIN) 300 MG capsule; TAKE 1 CAPSULE (300 MG TOTAL) BY MOUTH 4 (FOUR) TIMES DAILY  Midline low back pain without sciatica -     traMADol (ULTRAM) 50 MG tablet; Take 1 tablet (50 mg total) by mouth every 6 (six) hours as needed. -     triamcinolone ointment (KENALOG) 0.5 %; Apply 1 application topically 2 (two) times daily.     The patient was advised to call or return to clinic if he does not see an improvement in symptoms or to seek the care of the closest emergency department if he worsens with the above plan.   Philis Fendt, MHS, PA-C [facility] 09/30/2015 1:12 PM

## 2015-10-04 ENCOUNTER — Other Ambulatory Visit: Payer: Self-pay | Admitting: Physician Assistant

## 2016-01-02 ENCOUNTER — Other Ambulatory Visit: Payer: Self-pay | Admitting: Physician Assistant

## 2016-03-29 ENCOUNTER — Other Ambulatory Visit: Payer: Self-pay | Admitting: Physician Assistant

## 2016-03-30 NOTE — Telephone Encounter (Signed)
He needs labs and a physical before I can give him any more medication. Please see my last assessment and plan. Philis Fendt, MS, PA-C 6:13 PM, 03/30/2016

## 2016-04-21 ENCOUNTER — Ambulatory Visit (INDEPENDENT_AMBULATORY_CARE_PROVIDER_SITE_OTHER): Payer: Self-pay | Admitting: Physician Assistant

## 2016-04-21 VITALS — BP 136/90 | HR 77 | Temp 98.1°F | Resp 16 | Ht 66.5 in | Wt 157.0 lb

## 2016-04-21 DIAGNOSIS — L309 Dermatitis, unspecified: Secondary | ICD-10-CM

## 2016-04-21 DIAGNOSIS — G8929 Other chronic pain: Secondary | ICD-10-CM

## 2016-04-21 DIAGNOSIS — M549 Dorsalgia, unspecified: Secondary | ICD-10-CM

## 2016-04-21 MED ORDER — TRAMADOL HCL 50 MG PO TABS: 50.0000 mg | ORAL_TABLET | Freq: Four times a day (QID) | ORAL | 2 refills | Status: DC | PRN

## 2016-04-21 MED ORDER — CLOTRIMAZOLE-BETAMETHASONE 1-0.05 % EX CREA: 1.0000 "application " | TOPICAL_CREAM | Freq: Two times a day (BID) | CUTANEOUS | 0 refills | Status: AC

## 2016-04-21 MED ORDER — TRIAMCINOLONE ACETONIDE 0.5 % EX OINT: 1.0000 "application " | TOPICAL_OINTMENT | Freq: Two times a day (BID) | CUTANEOUS | 6 refills | Status: DC

## 2016-04-21 MED ORDER — GABAPENTIN 300 MG PO CAPS: ORAL_CAPSULE | ORAL | 5 refills | Status: DC

## 2016-04-21 NOTE — Progress Notes (Signed)
[facility] [address] [place] [address] [phone]  Date:  04/21/2016   Name:  Travis Weber   DOB:  01-May-1955   MRN:  [id number]  PCP:  Mack Hook, MD    History of Present Illness:  Travis Weber is a 61 y.o. male patient who presents to [facility] for refill of chronic back pain. Taking the gabapentin which helps him to sleep, and control back pain.  He states he can not tell if this is working or the tramadol.  He states that this is supposed to help his hand pain.  He is taking the gabapentin 4 times per day.   Tramadol is take 4 times per day, every 6 hours, which "keeps me on my feet".  He can not work but a couple hours.  Last refill was 6 months ago.   Triamcinolone: at least twice per day.  Used chronically. This improves it greatly.  He attempts to keep it moisturized with the addition of vaseline with the ointment.  This s on his hands and feet.          Patient Active Problem List   Diagnosis Date Noted  . THYROMEGALY 03/10/2010  . CHEST PAIN 03/10/2010  . DEGENERATIVE DISC DISEASE, LUMBOSACRAL SPINE W/RADICULOPATHY 12/04/2009  . ALCOHOL ABUSE, EPISODIC 03/07/2009  . HYPERGLYCEMIA 03/02/2008  . DYSHIDROTIC ECZEMA, 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  . TRIGGER FINGER, LEFT THUMB 04/17/2007  . CARPAL TUNNEL SYNDROME, BILATERAL 02/10/2007  . HYPERLIPIDEMIA 02/01/2007  . LOW BACK PAIN SYNDROME 02/01/2007    Past Medical History:  Diagnosis Date  . Arthritis   . Neuromuscular disorder [facility])     Past Surgical History:  Procedure Laterality Date  . APPENDECTOMY      Social History  Substance Use Topics  . Smoking status: Current Every Day Smoker    Packs/day: 1.00    Years: 49.00    Types: Cigarettes  . Smokeless tobacco: Not on file  . Alcohol use 0.0 oz/week    Family History  Problem Relation Age of Onset  . Heart disease Sister   . Hyperlipidemia Sister   . Hypertension Sister   . Hypertension Brother   . Hypertension Brother   .  Hypertension Sister     No Known Allergies  Medication list has been reviewed and updated.  Current Outpatient Prescriptions on File Prior to Visit  Medication Sig Dispense Refill  . gabapentin (NEURONTIN) 300 MG capsule TAKE 1 CAPSULE (300 MG TOTAL) BY MOUTH 4 (FOUR) TIMES DAILY 120 capsule 5  . traMADol (ULTRAM) 50 MG tablet Take 1 tablet (50 mg total) by mouth every 6 (six) hours as needed. 120 tablet 5  . triamcinolone ointment (KENALOG) 0.5 % Apply 1 application topically 2 (two) times daily. 15 g 6   No current facility-administered medications on file prior to visit.     ROS ROS otherwise unremarkable unless listed above.   Physical Examination: BP 136/90   Pulse 77   Temp 98.1 F (36.7 C) (Oral)   Resp 16   Ht 5' 6.5" (1.689 m)   Wt 157 lb (71.2 kg)   SpO2 98%   BMI 24.96 kg/m  Ideal Body Weight: Weight in (lb) to have BMI = 25: 156.9  Physical Exam  Constitutional: He is oriented to person, place, and time. He appears well-developed and well-nourished. No distress.  HENT:  Head: Normocephalic and atraumatic.  Eyes: Conjunctivae and EOM are normal. Pupils are equal, round, and reactive to light.  Cardiovascular: Normal rate.   Pulmonary/Chest: Effort normal. No respiratory distress.  Neurological: He is alert and oriented to person, place, and time.  Skin: Skin is warm and dry. He is not diaphoretic.  Palm of the left hand with scaling yellowing/white patches.  No erythema or open abrasions.  Non-tender.   Psychiatric: He has a normal mood and affect. His behavior is normal.     Assessment and Plan: Travis Weber is a 61 y.o. male who is here today for cc of medication refill. --discussed narrowing to one provider with this controlled med, a new policy complete with assessment of risk and drug testing.  Advised that he would need to choose one provider to obtain controlled medication and follow up.  He states that he would like to go to a friend at [place], and was arranging for this in a few months.  He would not like this here and routine drug screening.   I have agreed to 3 months.  He will attend the pcp at [facility] physicians, or return with a pcp in mind here along with drug testing as discussed.  He voiced understanding.   Eczema, unspecified type - Plan: triamcinolone ointment (KENALOG) 0.5 %, clotrimazole-betamethasone (LOTRISONE) cream  Chronic back pain, unspecified back location, unspecified back pain laterality - Plan: traMADol (ULTRAM) 50 MG tablet, gabapentin (NEURONTIN) 300 MG capsule  Ivar Drape, PA-C [facility] 1/3/20189:41 PM  There are no diagnoses linked to this encounter.  Ivar Drape, PA-C [facility] 04/21/2016 11:28 AM

## 2016-04-21 NOTE — Patient Instructions (Addendum)
  Gabapentin refilled for 3 months.  The tramadol was filled for 3 months.     IF you received an x-ray today, you will receive an invoice from [facility]. Please contact [facility] at [phone] with questions or concerns regarding your invoice.   IF you received labwork today, you will receive an invoice from [facility]. Please contact [facility] at [phone] with questions or concerns regarding your invoice.   Our billing staff will not be able to assist you with questions regarding bills from these companies.  You will be contacted with the lab results as soon as they are available. The fastest way to get your results is to activate your My Chart account. Instructions are located on the last page of this paperwork. If you have not heard from [place] regarding the results in 2 weeks, please contact this office.

## 2016-06-03 ENCOUNTER — Other Ambulatory Visit: Payer: Self-pay | Admitting: Physician Assistant

## 2016-09-15 ENCOUNTER — Other Ambulatory Visit: Payer: Self-pay | Admitting: Physician Assistant

## 2016-09-15 DIAGNOSIS — G8929 Other chronic pain: Secondary | ICD-10-CM

## 2016-09-15 DIAGNOSIS — M549 Dorsalgia, unspecified: Principal | ICD-10-CM

## 2016-11-16 ENCOUNTER — Other Ambulatory Visit: Payer: Self-pay | Admitting: Physician Assistant

## 2016-11-16 DIAGNOSIS — G8929 Other chronic pain: Secondary | ICD-10-CM

## 2016-11-16 DIAGNOSIS — M549 Dorsalgia, unspecified: Principal | ICD-10-CM

## 2016-12-13 ENCOUNTER — Other Ambulatory Visit: Payer: Self-pay | Admitting: Physician Assistant

## 2016-12-13 DIAGNOSIS — G8929 Other chronic pain: Secondary | ICD-10-CM

## 2016-12-13 DIAGNOSIS — M549 Dorsalgia, unspecified: Principal | ICD-10-CM

## 2016-12-22 ENCOUNTER — Encounter: Payer: Self-pay | Admitting: Physician Assistant

## 2016-12-22 ENCOUNTER — Ambulatory Visit (INDEPENDENT_AMBULATORY_CARE_PROVIDER_SITE_OTHER): Payer: Self-pay | Admitting: Physician Assistant

## 2016-12-22 VITALS — BP 138/89 | HR 87 | Temp 98.2°F | Resp 18 | Ht 65.75 in | Wt 160.4 lb

## 2016-12-22 DIAGNOSIS — G8929 Other chronic pain: Secondary | ICD-10-CM

## 2016-12-22 DIAGNOSIS — L309 Dermatitis, unspecified: Secondary | ICD-10-CM

## 2016-12-22 DIAGNOSIS — M549 Dorsalgia, unspecified: Secondary | ICD-10-CM

## 2016-12-22 DIAGNOSIS — H578 Other specified disorders of eye and adnexa: Secondary | ICD-10-CM

## 2016-12-22 DIAGNOSIS — H5789 Other specified disorders of eye and adnexa: Secondary | ICD-10-CM

## 2016-12-22 MED ORDER — TRIAMCINOLONE ACETONIDE 0.5 % EX OINT: 1.0000 "application " | TOPICAL_OINTMENT | Freq: Two times a day (BID) | CUTANEOUS | 3 refills | Status: DC

## 2016-12-22 MED ORDER — TRAMADOL HCL 50 MG PO TABS: 50.0000 mg | ORAL_TABLET | Freq: Four times a day (QID) | ORAL | 2 refills | Status: AC | PRN

## 2016-12-22 MED ORDER — ERYTHROMYCIN 5 MG/GM OP OINT: 1.0000 "application " | TOPICAL_OINTMENT | Freq: Every day | OPHTHALMIC | 0 refills | Status: AC

## 2016-12-22 MED ORDER — GABAPENTIN 300 MG PO CAPS: ORAL_CAPSULE | ORAL | 5 refills | Status: AC

## 2016-12-22 NOTE — Patient Instructions (Signed)
     IF you received an x-ray today, you will receive an invoice from [facility]. Please contact  Radiology at [phone] with questions or concerns regarding your invoice.   IF you received labwork today, you will receive an invoice from [facility]. Please contact [facility] at [phone] with questions or concerns regarding your invoice.   Our billing staff will not be able to assist you with questions regarding bills from these companies.  You will be contacted with the lab results as soon as they are available. The fastest way to get your results is to activate your My Chart account. Instructions are located on the last page of this paperwork. If you have not heard from us regarding the results in 2 weeks, please contact this office.     

## 2016-12-22 NOTE — Progress Notes (Signed)
PRIMARY CARE AT [facility] [address] [place] [address] [phone]  Date:  12/22/2016   Name:  Travis Weber   DOB:  07-04-1955   MRN:  [id number]  PCP:  Patient, No Pcp Per    History of Present Illness:  Travis Weber is a 61 y.o. male patient who presents to PCP with  Chief Complaint  Patient presents with  . Medication Refill    Gabapentin, tramadol, triamcinolone Acetonide     --tramadol: he has not taken  --degenerative disc disease, lumbosacral spine with radiculopathy.  Shoulder pain, and hand pain.  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    --gabapentin: he takes 4 per day of the 300mg .  He has been using sparingly since he has this. --no vegetables daily, but has a lot of carbs. --he continues to have eczema of his hands.     Patient Active Problem List   Diagnosis Date Noted  . THYROMEGALY 03/10/2010  . CHEST PAIN 03/10/2010  . DEGENERATIVE DISC DISEASE, LUMBOSACRAL SPINE W/RADICULOPATHY 12/04/2009  . ALCOHOL ABUSE, EPISODIC 03/07/2009  . HYPERGLYCEMIA 03/02/2008  . DYSHIDROTIC ECZEMA, 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  . TRIGGER FINGER, LEFT THUMB 04/17/2007  . CARPAL TUNNEL SYNDROME, BILATERAL 02/10/2007  . HYPERLIPIDEMIA 02/01/2007  . LOW BACK PAIN SYNDROME 02/01/2007    Past Medical History:  Diagnosis Date  . Arthritis   . Neuromuscular disorder [facility])     Past Surgical History:  Procedure Laterality Date  . APPENDECTOMY      Social History  Substance Use Topics  . Smoking status: Current Every Day Smoker    Packs/day: 1.00    Years: 49.00    Types: Cigarettes  . Smokeless tobacco: Never Used  . Alcohol use 0.0 oz/week    Family History  Problem Relation Age of Onset  . Heart disease Sister   . Hyperlipidemia Sister   . Hypertension Sister   . Hypertension Brother   . Hypertension Brother   . Hypertension Sister     No Known Allergies  Medication list has been reviewed and updated.  Current Outpatient Prescriptions on  File Prior to Visit  Medication Sig Dispense Refill  . clotrimazole-betamethasone (LOTRISONE) cream Apply 1 application topically 2 (two) times daily. (Patient not taking: Reported on 12/22/2016) 15 g 0   No current facility-administered medications on file prior to visit.     ROS ROS otherwise unremarkable unless listed above.  Physical Examination: BP 138/89 (BP Location: Right Arm, Patient Position: Sitting, Cuff Size: Normal)   Pulse 87   Temp 98.2 F (36.8 C) (Oral)   Resp 18   Ht 5' 5.75" (1.67 m)   Wt 160 lb 6.4 oz (72.8 kg)   SpO2 97%   BMI 26.09 kg/m  Ideal Body Weight: Weight in (lb) to have BMI = 25: 153.4  Physical Exam  Constitutional: He is oriented to person, place, and time. He appears well-developed and well-nourished. No distress.  HENT:  Head: Normocephalic and atraumatic.  Eyes: Pupils are equal, round, and reactive to light. Conjunctivae and EOM are normal. Right conjunctiva is not injected. Left conjunctiva is not injected.  Tiny mass along the medial area of granulated tissue.    Cardiovascular: Normal rate, regular rhythm and normal heart sounds.  Exam reveals no friction rub.   No murmur heard. Pulmonary/Chest: Effort normal and breath sounds normal. No respiratory distress.  Musculoskeletal: Normal range of motion.  Neurological: He is alert and oriented to person, place, and time.  Skin: Skin is warm and dry. He is not diaphoretic.  Psychiatric: He has a normal mood and affect. His behavior is normal.     Assessment and Plan: Travis Weber is a 61 y.o. male who is here today for cc of  Chief Complaint  Patient presents with  . Medication Refill    Gabapentin, tramadol, triamcinolone Acetonide   [place] substance control ran Policy reviewed, patient aware of possible drug testing. Eczema, unspecified type - Plan: triamcinolone ointment (KENALOG) 0.5 %  Chronic back pain, unspecified back location, unspecified back pain laterality - Plan: gabapentin  (NEURONTIN) 300 MG capsule, traMADol (ULTRAM) 50 MG tablet  Ivar Drape, PA-C [facility] 9/9/20185:04 PM

## 2017-04-03 ENCOUNTER — Other Ambulatory Visit: Payer: Self-pay | Admitting: Physician Assistant

## 2017-04-03 DIAGNOSIS — L309 Dermatitis, unspecified: Secondary | ICD-10-CM

## 2017-04-28 ENCOUNTER — Telehealth: Payer: Self-pay | Admitting: Physician Assistant

## 2017-04-28 NOTE — Telephone Encounter (Signed)
Copied from [place]. Topic: Quick Communication - See Telephone Encounter >> Apr 28, 2017  3:06 PM Boyd Kerbs wrote: CRM for notification. See Telephone encounter for:   Pt called about prescription that was refused for his feet.  He is not sure if he needs to have an appt. to see doctor or just go to lab and how much it would cost.  04/28/17.

## 2017-04-29 NOTE — Telephone Encounter (Signed)
Pt needs OV  Copied from [place] [id number]. Topic: Inquiry >> Apr 29, 2017 10:47 AM Conception Chancy, NT wrote: Reason for CRM: patient states he would like to get a refill on Tramadol but he was under the impression Colletta Maryland English wanted him to have lab work and a urinalysis done before hand. Patient does not have insurance and wants to know the exact cost of everything so that he can come in and get a refill on the Tramdaol.

## 2017-04-29 NOTE — Telephone Encounter (Signed)
Triamcinolone ointment

## 2017-04-29 NOTE — Telephone Encounter (Signed)
Confirm what medication the patient was needing refilled?

## 2017-04-29 NOTE — Telephone Encounter (Signed)
Called pt to let him know that I can't give an exact price of labs or uranalysis. There are no orders in for anything. He wold like for Colletta Maryland to figure out what "bodily fluids" he would need to give and how much that would cost. Again, he is a self pay and will need to know how much he will be spending to get the refill of Tramadol. I explained that Colletta Maryland is not here today but I am putting the note in for her and it may be next week before she can get back in touch with him.  Best callback number is [phone]

## 2017-04-30 NOTE — Telephone Encounter (Signed)
This is not every time but random UDS. Cost refer to lab.

## 2017-05-02 NOTE — Telephone Encounter (Signed)
Pt needs office visit to discuss what needs to be done. Once he comes in a estimated cost for items needed will be given.

## 2017-05-02 NOTE — Telephone Encounter (Signed)
Called pt and left message stating what Jenny Reichmann says above. He needs to schedule the OV and figure out what will need to be done and then we can get an estimated cost.

## 2017-05-29 ENCOUNTER — Emergency Department (HOSPITAL_COMMUNITY)
Admission: EM | Admit: 2017-05-29 | Discharge: 2017-05-29 | Disposition: A | Discharge status: Home / Self Care | Payer: Self-pay | Attending: Emergency Medicine | Admitting: Emergency Medicine

## 2017-05-29 ENCOUNTER — Encounter (HOSPITAL_COMMUNITY): Payer: Self-pay | Admitting: Emergency Medicine

## 2017-05-29 ENCOUNTER — Other Ambulatory Visit: Payer: Self-pay

## 2017-05-29 DIAGNOSIS — L309 Dermatitis, unspecified: Secondary | ICD-10-CM

## 2017-05-29 DIAGNOSIS — L259 Unspecified contact dermatitis, unspecified cause: Secondary | ICD-10-CM | POA: Insufficient documentation

## 2017-05-29 DIAGNOSIS — F17210 Nicotine dependence, cigarettes, uncomplicated: Secondary | ICD-10-CM | POA: Insufficient documentation

## 2017-05-29 DIAGNOSIS — Z79899 Other long term (current) drug therapy: Secondary | ICD-10-CM | POA: Insufficient documentation

## 2017-05-29 MED ORDER — TRIAMCINOLONE ACETONIDE 0.1 % EX CREA: 1.0000 "application " | TOPICAL_CREAM | Freq: Two times a day (BID) | CUTANEOUS | 0 refills | Status: AC

## 2017-05-29 NOTE — ED Triage Notes (Signed)
Reports ongoing sores on feet from eczema for several years.  Here due to it getting worse.

## 2017-05-29 NOTE — ED Provider Notes (Signed)
[facility] EMERGENCY DEPARTMENT Provider Note   CSN: [id number] Arrival date & time: 05/29/17  2831     History   Chief Complaint Chief Complaint  Patient presents with  . sores on feet    HPI Travis Weber is a 62 y.o. male.  The history is provided by the patient and medical records. No language interpreter was used.   Travis Weber is a 62 y.o. male  with a PMH of eczema who presents to the Emergency Department complaining of bilateral foot pain due to sores on his feet for several years. He typically is prescribed ointment, usually Triamcinolone, and has good response with this. His PCP has quit writing this medication for him and about 2-3 days ago, his symptoms worsened. He reports sores to the bottom of both feet c/w his usual eczema flares. Denies redness, fever, chills, numbness, weakness.   Past Medical History:  Diagnosis Date  . Arthritis   . Neuromuscular disorder [facility])     Patient Active Problem List   Diagnosis Date Noted  . THYROMEGALY 03/10/2010  . CHEST PAIN 03/10/2010  . DEGENERATIVE DISC DISEASE, LUMBOSACRAL SPINE W/RADICULOPATHY 12/04/2009  . ALCOHOL ABUSE, EPISODIC 03/07/2009  . HYPERGLYCEMIA 03/02/2008  . DYSHIDROTIC ECZEMA, 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  . TRIGGER FINGER, LEFT THUMB 04/17/2007  . CARPAL TUNNEL SYNDROME, BILATERAL 02/10/2007  . HYPERLIPIDEMIA 02/01/2007  . LOW BACK PAIN SYNDROME 02/01/2007    Past Surgical History:  Procedure Laterality Date  . APPENDECTOMY         Home Medications    Prior to Admission medications   Medication Sig Start Date End Date Taking? Authorizing Provider  clotrimazole-betamethasone (LOTRISONE) cream Apply 1 application topically 2 (two) times daily. Patient not taking: Reported on 12/22/2016 04/21/16   Ivar Drape D, PA  erythromycin [facility]) ophthalmic ointment Place 1 application into the left eye at bedtime. 12/22/16   Ivar Drape D, PA  gabapentin (NEURONTIN) 300 MG capsule  Take 2 tablets in the morning, take 1 tablet in the afternoon, and take 2 tablets in the evening. 12/22/16   Ivar Drape D, PA  traMADol (ULTRAM) 50 MG tablet Take 1 tablet (50 mg total) by mouth every 6 (six) hours as needed. 12/22/16   Ivar Drape D, PA  triamcinolone cream (KENALOG) 0.1 % Apply 1 application topically 2 (two) times daily. 05/29/17   Ramya Vanbergen, Ozella Almond, PA-C    Family History Family History  Problem Relation Age of Onset  . Heart disease Sister   . Hyperlipidemia Sister   . Hypertension Sister   . Hypertension Brother   . Hypertension Brother   . Hypertension Sister     Social History Social History   Tobacco Use  . Smoking status: Current Every Day Smoker    Packs/day: 1.00    Years: 49.00    Pack years: 49.00    Types: Cigarettes  . Smokeless tobacco: Never Used  Substance Use Topics  . Alcohol use: Yes    Alcohol/week: 0.0 oz  . Drug use: No     Allergies   Patient has no known allergies.   Review of Systems Review of Systems  Constitutional: Negative for chills and fever.  Musculoskeletal: Positive for myalgias.  Skin: Positive for wound.  Neurological: Negative for weakness and numbness.     Physical Exam Updated Vital Signs BP (!) 154/94 (BP Location: Right Arm)   Pulse 86   Temp (!) 97.5 F (36.4 C) (Oral)   Resp 18   Ht  5\' 7"  (1.702 m)   Wt 72.6 kg (160 lb)   SpO2 97%   BMI 25.06 kg/m   Physical Exam  Constitutional: He appears well-developed and well-nourished. No distress.  HENT:  Head: Normocephalic and atraumatic.  Neck: Neck supple.  Cardiovascular: Normal rate, regular rhythm and normal heart sounds.  No murmur heard. Pulmonary/Chest: Effort normal and breath sounds normal. No respiratory distress. He has no wheezes. He has no rales.  Musculoskeletal: Normal range of motion.  Neurological: He is alert.  Skin: Skin is warm and dry.  Bilateral plantar surfaces with 2x3 cm dry thickened skin c/w eczema. No  surrounding erythema. No warmth. 2+ DP's bilaterally.   Nursing note and vitals reviewed.    ED Treatments / Results  Labs (all labs ordered are listed, but only abnormal results are displayed) Labs Reviewed - No data to display  EKG  EKG Interpretation None       Radiology No results found.  Procedures Procedures (including critical care time)  Medications Ordered in ED Medications - No data to display   Initial Impression / Assessment and Plan / ED Course  I have reviewed the triage vital signs and the nursing notes.  Pertinent labs & imaging results that were available during my care of the patient were reviewed by me and considered in my medical decision making (see chart for details).  Clinical Course as of May 30 835  Sun May 30, 6187  3594 62 year old male with history of eczema complaining of worsening rash on the bottom of his feet.  There is some crusting thickened skin consistent with dry skin and eczema.  States he usually improves with steroids but his PCP is unwilling to provide any so we will write him a prescription for same.  [MB]    Clinical Course User Index [MB] Travis Rasmussen, MD   Travis Weber is a 62 y.o. male who presents to ED for rash to the bottom of his feet c/w eczema. Hx of similar for several years now, but unfortunately has not been able to get refill of steroid cream. Refill provided in ER today. PCP or podiatry follow up encouraged for further management of his chronic condition. Reasons to return to ER discussed and all questions answered.   Patient seen by and discussed with Dr. Melina Weber who agrees with treatment plan.    Final Clinical Impressions(s) / ED Diagnoses   Final diagnoses:  Eczema, unspecified type    ED Discharge Orders        Ordered    triamcinolone cream (KENALOG) 0.1 %  2 times daily     05/29/17 0757       Kelcey Korus, Ozella Almond, PA-C 05/29/17 8250    Travis Rasmussen, MD 05/29/17 1902

## 2017-05-29 NOTE — Discharge Instructions (Signed)
It was my pleasure taking care of you today!   Apply cream as directed.   Please follow up with a primary care or the podiatrist listed.   Return to ER for new or worsening symptoms, any additional concerns.   To find a primary care or specialty doctor please call [phone] or [phone] to access "[facility] a Doctor Service."  You may also go on the [facility] website at [email or web address]  There are also multiple [facility] and [facility] practices throughout the [place] that are frequently accepting new patients. You may find a clinic that is close to your home and contact them.  [place] [id number] [phone]  [facility] in [place] (also locations in [place] and [place]) - [place] [phone]  [place] [phone]

## 2017-07-27 ENCOUNTER — Encounter: Payer: Self-pay | Admitting: Physician Assistant

## 2019-04-02 ENCOUNTER — Emergency Department (HOSPITAL_COMMUNITY)
Admission: EM | Admit: 2019-04-02 | Discharge: 2019-04-03 | Disposition: A | Discharge status: Home / Self Care | Payer: Medicare Other | Attending: Emergency Medicine | Admitting: Emergency Medicine

## 2019-04-02 ENCOUNTER — Encounter (HOSPITAL_COMMUNITY): Payer: Self-pay

## 2019-04-02 ENCOUNTER — Emergency Department (HOSPITAL_COMMUNITY): Payer: Medicare Other

## 2019-04-02 ENCOUNTER — Other Ambulatory Visit: Payer: Self-pay

## 2019-04-02 DIAGNOSIS — Z5321 Procedure and treatment not carried out due to patient leaving prior to being seen by health care provider: Secondary | ICD-10-CM | POA: Insufficient documentation

## 2019-04-02 DIAGNOSIS — R079 Chest pain, unspecified: Secondary | ICD-10-CM | POA: Diagnosis not present

## 2019-04-02 LAB — CBC
HCT: 42.3 % (ref 39.0–52.0)
Hemoglobin: 14 g/dL (ref 13.0–17.0)
MCH: 30 pg (ref 26.0–34.0)
MCHC: 33.1 g/dL (ref 30.0–36.0)
MCV: 90.6 fL (ref 80.0–100.0)
Platelets: 218 10*3/uL (ref 150–400)
RBC: 4.67 MIL/uL (ref 4.22–5.81)
RDW: 12.5 % (ref 11.5–15.5)
WBC: 7.1 10*3/uL (ref 4.0–10.5)
nRBC: 0 % (ref 0.0–0.2)

## 2019-04-02 LAB — BASIC METABOLIC PANEL
Anion gap: 10 (ref 5–15)
BUN: 14 mg/dL (ref 8–23)
CO2: 26 mmol/L (ref 22–32)
Calcium: 9.1 mg/dL (ref 8.9–10.3)
Chloride: 104 mmol/L (ref 98–111)
Creatinine, Ser: 0.95 mg/dL (ref 0.61–1.24)
GFR calc Af Amer: >60 mL/min (ref >60)
GFR calc non Af Amer: >60 mL/min (ref >60)
Glucose, Bld: 91 mg/dL (ref 70–99)
Potassium: 4.2 mmol/L (ref 3.5–5.1)
Sodium: 140 mmol/L (ref 135–145)

## 2019-04-02 LAB — TROPONIN I (HIGH SENSITIVITY): Troponin I (High Sensitivity): 3 ng/L (ref <18)

## 2019-04-02 MED ORDER — SODIUM CHLORIDE 0.9% FLUSH: 3.0000 mL | Freq: Once | INTRAVENOUS | Status: DC

## 2019-04-02 NOTE — ED Notes (Signed)
x1 No answer for recheck of V/S

## 2019-04-02 NOTE — ED Triage Notes (Signed)
Pt states chest pain on and off for "a while". Pt states left sided. Pt states his clinic would have sent him here for eval.

## 2019-04-03 NOTE — ED Notes (Signed)
x3 No answer 

## 2019-06-24 ENCOUNTER — Ambulatory Visit: Payer: Medicare Other | Attending: Internal Medicine

## 2019-06-24 DIAGNOSIS — Z23 Encounter for immunization: Secondary | ICD-10-CM | POA: Insufficient documentation

## 2019-06-24 NOTE — Progress Notes (Signed)
   Covid-19 Vaccination Clinic  Name:  Travis Weber    MRN: [id number] DOB: 1955/06/13  06/24/2019  [name] was observed post Covid-19 immunization for 15 minutes without incident. He was provided with Vaccine Information Sheet and instruction to access the V-Safe system.   [name] was instructed to call 911 with any severe reactions post vaccine: Marland Kitchen Difficulty breathing  . Swelling of face and throat  . A fast heartbeat  . A bad rash all over body  . Dizziness and weakness   Immunizations Administered    Name Date Dose VIS Date Route   Pfizer COVID-19 Vaccine 06/24/2019 12:31 AM 0.3 mL 03/30/2019 Intramuscular   Manufacturer: [place]   Lot: [id number]   [place]: [id number]

## 2019-07-25 ENCOUNTER — Ambulatory Visit: Payer: Medicare Other | Attending: Internal Medicine

## 2019-07-25 DIAGNOSIS — Z23 Encounter for immunization: Secondary | ICD-10-CM

## 2019-07-25 NOTE — Progress Notes (Signed)
   Covid-19 Vaccination Clinic  Name:  JEANCARLOS MCMAHEN    MRN: [id number] DOB: 1955/09/24  07/25/2019  Mr. Bonwell was observed post Covid-19 immunization for 15 minutes without incident. He was provided with Vaccine Information Sheet and instruction to access the V-Safe system.   Mr. Rozario was instructed to call 911 with any severe reactions post vaccine: Marland Kitchen Difficulty breathing  . Swelling of face and throat  . A fast heartbeat  . A bad rash all over body  . Dizziness and weakness   Immunizations Administered    Name Date Dose VIS Date Route   Pfizer COVID-19 Vaccine 07/25/2019  9:02 AM 0.3 mL 03/30/2019 Intramuscular   Manufacturer: [place]   Lot: [id number]   [place]: [id number]

## 2020-05-21 ENCOUNTER — Ambulatory Visit (INDEPENDENT_AMBULATORY_CARE_PROVIDER_SITE_OTHER): Payer: Medicare Other | Admitting: Licensed Clinical Social Worker

## 2020-05-21 ENCOUNTER — Other Ambulatory Visit: Payer: Self-pay

## 2020-05-21 DIAGNOSIS — F411 Generalized anxiety disorder: Secondary | ICD-10-CM

## 2020-05-28 NOTE — Progress Notes (Signed)
Comprehensive Clinical Assessment (CCA) Note  05/28/2020 Travis Weber [id number]  Chief Complaint:  Chief Complaint  Patient presents with  . Anxiety  . Depression   Visit Diagnosis: GAD  CCA Biopsychosocial Intake/Chief Complaint:  Anxiety with intermittent depression  Current Symptoms/Problems: Irritability, sporadic sleep, worry, tension  Patient Reported Schizophrenia/Schizoaffective Diagnosis in Past: No  Strengths: Seeking help  Preferences: In person sessions  Type of Services Patient Feels are Needed: Counseling, refuses med management  Initial Clinical Notes/Concerns: LCSW reviewed informed consent for counseling with pt's full acknowledgement. Pt reports he has tried counseling before but the last time probably 20 yrs ago. Pt reports being very anxious, irritable, negative and stressed. He denies panic. He states he would like to have some help r/t coping and interpersonal relationships. Pt states he has "lived like a dog for the last 10 yrs". 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 Pt reports major financial strain and states "Poverty is traumatic". Pt reports good friendships but "lonely" often and would like to find a compatible male companion. Pt refuses med management eval saying "I don't want to get in any deeper with [facility] than this".   Mental Health Symptoms Depression:  Change in energy/activity; Irritability   Duration of Depressive symptoms: Greater than two weeks   Mania:  None   Anxiety:   Irritability; Restlessness; Sleep; Tension; Worrying   Psychosis:  None   Duration of Psychotic symptoms: No data recorded  Trauma:  None   Obsessions:  None   Compulsions:  None   Inattention:  None   Hyperactivity/Impulsivity:  N/A   Oppositional/Defiant Behaviors:  N/A   Emotional Irregularity:  Intense/unstable relationships; Mood lability   Other Mood/Personality Symptoms:  Pt states "I have a terrible attitude about a lot of  things".    Mental Status Exam Appearance and self-care  Stature:  Small   Weight:  Average weight   Clothing:  No data recorded  Grooming:  Normal   Cosmetic use:  None   Posture/gait:  Tense   Motor activity:  Agitated (Pt angery about new pt paperwork and throws clipboard into neighboring chair.)   Sensorium  Attention:  Persistent   Concentration:  Variable   Orientation:  X5   Recall/memory:  Normal   Affect and Mood  Affect:  Anxious; Negative   Mood:  Anxious; Dysphoric; Irritable; Negative; Pessimistic   Relating  Eye contact:  Avoided   Facial expression:  Tense   Attitude toward examiner:  Irritable; Cooperative   Thought and Language  Speech flow: Other (Comment) (Rapid)   Thought content:  Appropriate to Mood and Circumstances (Very negative thougt patterns)   Preoccupation:  Ruminations   Hallucinations:  None   Organization:  No data recorded  [employer] of Knowledge:  Average   Intelligence:  Average   Abstraction:  Normal   Judgement:  Common-sensical   Reality Testing:  Adequate   Insight:  Present   Decision Making:  Normal   Social Functioning  Social Maturity:  Responsible   Social Judgement:  Victimized   Stress  Stressors:  Family conflict; Housing; Museum/gallery curator; Work   Coping Ability:  Overwhelmed; Exhausted   Skill Deficits:  Interpersonal (Reports he has physical aches/pain all the time.)   Supports:  Family; Friends/Service system     Religion: Religion/Spirituality Are You A Religious Person?: No  Leisure/Recreation: Leisure / Recreation Do You Have Hobbies?: Yes Leisure and Hobbies: Metal sculpture  Exercise/Diet:  Exercise/Diet Do You Exercise?: Yes What Type of Exercise Do You Do?: Run/Walk (Walks with a friend) How Many Times a Week Do You Exercise?: 1-3 times a week Do You Have Any Trouble Sleeping?: Yes Explanation of Sleeping Difficulties: "Sporadic"  CCA  Employment/Education Employment/Work Situation: Employment / Work Situation Employment situation: Employed 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 Has patient ever been in the [employer]?: No  Education: Education Is Patient Currently Attending School?: No Did Teacher, adult education From [employer]?: No (GED) Did You Have Any Difficulty At [employer]?: Yes (States he was not a good Ship broker and probably had what would today be considered ADHD)  CCA Family/Childhood History Family and Relationship History: Family history Marital status: Divorced Divorced, when?: 20 yrs ago What types of issues is patient dealing with in the relationship?: No current relationship Additional relationship information: Would like to find compainionship What is your sexual orientation?: Heterosexual Does patient have children?: Yes How many children?: 1 How is patient's relationship with their children?: Close, Dtr, lives in [place] History:  Childhood History By whom was/is the patient raised?: Both parents Additional childhood history information: Pt states he was next to the last and felt he was a problem child, not a good Ship broker. Description of patient's relationship with caregiver when they were a child: States father was an alcoholic and gone a lot. Does patient have siblings?: Yes Number of Siblings: 5 (3 bro, 2 sis) Description of patient's current relationship with siblings: No contact with one bro x15 yrs, worked together and "he shafted me". Reports siblings all upper middle call and very religious. Minimal involvement. Did patient suffer any verbal/emotional/physical/sexual abuse as a child?: No Did patient suffer from severe childhood neglect?: No Has patient ever been sexually abused/assaulted/raped as an adolescent or adult?: No Witnessed domestic violence?: No Has patient been affected by domestic violence as an adult?: No    CCA Substance Use Alcohol/Drug Use: Alcohol / Drug Use History of alcohol / drug use?: Yes (Pt repors he stopped drinking 2 yrs ago, stopped smoking cigarettes one yr ago and recently stopped smoking cannabis. Reports he tried AA in the 80's and 90's but otherwise has stopped using substances on his own.)   DSM5 Diagnoses: Patient Active Problem List   Diagnosis Date Noted  . THYROMEGALY 03/10/2010  . CHEST PAIN 03/10/2010  . DEGENERATIVE DISC DISEASE, LUMBOSACRAL SPINE W/RADICULOPATHY 12/04/2009  . ALCOHOL ABUSE, EPISODIC 03/07/2009  . HYPERGLYCEMIA 03/02/2008  . DYSHIDROTIC ECZEMA, 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  . TRIGGER FINGER, LEFT THUMB 04/17/2007  . CARPAL TUNNEL SYNDROME, BILATERAL 02/10/2007  . HYPERLIPIDEMIA 02/01/2007  . LOW BACK PAIN SYNDROME 02/01/2007    Patient Centered Plan: Patient is on the following Treatment Plan(s):  Anxiety  Hermine Messick, LCSW

## 2020-06-11 ENCOUNTER — Ambulatory Visit (INDEPENDENT_AMBULATORY_CARE_PROVIDER_SITE_OTHER): Payer: Medicare Other | Admitting: Licensed Clinical Social Worker

## 2020-06-11 ENCOUNTER — Other Ambulatory Visit: Payer: Self-pay

## 2020-06-11 DIAGNOSIS — F411 Generalized anxiety disorder: Secondary | ICD-10-CM

## 2020-06-13 NOTE — Progress Notes (Signed)
   THERAPIST PROGRESS NOTE  Session Time: 45 min  Participation Level: Active  Behavioral Response: CasualAlertNegative and Anxious  Type of Therapy: Individual Therapy  Treatment Goals addressed: Anxiety and Coping  Interventions: Supportive  Summary: Travis Weber is a 65 y.o. male who presents with hx of GAD. Pt comes for in person session today. He continues to present as anxious and negative yet less negative than initial/last session. Pt much less irritable. LCSW assessed for any significant changes since last session. 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 Pt provides details. He is being allowed to pick if he wants to work part or full time. Pt undecided. He states he is meeting with a CPA friend to talk about how this may impact taxes/benefits, etc. 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 LCSW assessed for status of sculpture he was working on. He states it is completed and shares pic. Pt reports he got good news from his 67 yr old dtr in [place]. He states she has gotten a raise/promotion at work and how proud he is of her. Pt vents about how angry he gets with dtr and her mother for not answering their phones/poor communication. He advises he stopped calling them because of this and just waits until they want to be in touch. Pt reports he has started smoking cannabis again. He also reports on pain management clinic appt at [facility] where he was told he would be given Alprazolam and a sleep med but it did not get sent to pharmacy and has not been straightened out yet despite calls and going back to the office to speak with staff. Pt vents about how unhappy he is with [facility] and their practices. Pt plans to f/u more today to obtain meds. Pt states he does get tramadol from them and feels this helps with his mood. Pt very talkative and somewhat tangential today. He continues to be  interested in ongoing counseling. LCSW reviewed poc including scheduling prior to close of session. Pt states appreciation for care.    Suicidal/Homicidal: Nowithout intent/plan  Therapist Response: Pt remains receptive to care. Self talk lit next session.  Plan: Return again in  weeks.  Diagnosis: Axis I: Generalized Anxiety Disorder    Axis II: Deferred  Hermine Messick, LCSW 06/13/2020

## 2020-07-01 ENCOUNTER — Encounter: Payer: Self-pay | Admitting: Internal Medicine

## 2020-07-16 ENCOUNTER — Ambulatory Visit (HOSPITAL_COMMUNITY): Payer: Self-pay | Admitting: Licensed Clinical Social Worker

## 2020-07-17 ENCOUNTER — Ambulatory Visit (INDEPENDENT_AMBULATORY_CARE_PROVIDER_SITE_OTHER): Payer: Medicare Other | Admitting: Licensed Clinical Social Worker

## 2020-07-17 ENCOUNTER — Other Ambulatory Visit: Payer: Self-pay

## 2020-07-17 DIAGNOSIS — F411 Generalized anxiety disorder: Secondary | ICD-10-CM

## 2020-07-21 NOTE — Progress Notes (Signed)
   THERAPIST PROGRESS NOTE  Session Time: 55 min  Participation Level: Active  Behavioral Response: CasualAlertNegative and Anxious  Type of Therapy: Individual Therapy  Treatment Goals addressed: Anxiety and Coping  Interventions: CBT and Supportive  Summary: Travis Weber is a 65 y.o. male who presents with hx of GAD. This date pt comes for in person session. Last session 06/11/20. Pt states he did have to cancel an interim appt d/t new job. LCSW assessed for status of new job. Pt reports he decided on PT and "It is harder than I thought". He provided details. Reports [place], which is very intense, heavy traffic at hours he is traveling. 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 Pt vents about problematic customer he has had to do some work over for in [employer] and how he dreads potentially seeing her this wk. He provides multiple details. He is already thinking he may lose his temper. Assisted pt to process options/consequences//irritability. Pt is taking meds as prescribed. States he stopped cannabis over a month ago and misses it. Reports he has an appt with PCP this wk and is also dreading this. He again says how much he dislikes PCP and the drug testing. He sees this as being more about "morals" and has a hard time explaining his arrival at this thought. Pt continues to have problems with sleep he will address with PCP. Pt states he is eating only one meal a day at night. This was addressed in some details r/t his health and coping. Pt mentions new issues around his living/property. He states there is a problem with others feeding stray/ferrel cats. There is also new demolition of woods he loves distressing him. LCSW provided active and supportive listening. LCSW introduced the power of self talk to pt. He is attentive and agrees he is overly negative and often catastrophizes. Pt accepts literature to take home  to read with additional time planned addressing topic next session. Reviewed poc including scheduling prior to close of session. Pt states appreciation for care.  Suicidal/Homicidal: Nowithout intent/plan  Therapist Response: Pt remains receptive to care.  Plan: Return again for next avail appt.  Diagnosis: Axis I: Generalized Anxiety Disorder  Hermine Messick, LCSW 07/21/2020

## 2020-08-01 ENCOUNTER — Ambulatory Visit (HOSPITAL_COMMUNITY): Payer: Medicare Other | Admitting: Licensed Clinical Social Worker

## 2020-09-04 ENCOUNTER — Ambulatory Visit (HOSPITAL_COMMUNITY): Payer: Medicare Other | Admitting: Licensed Clinical Social Worker

## 2021-02-28 ENCOUNTER — Other Ambulatory Visit: Payer: Self-pay

## 2021-02-28 ENCOUNTER — Encounter (HOSPITAL_COMMUNITY): Payer: Self-pay | Admitting: Emergency Medicine

## 2021-02-28 ENCOUNTER — Emergency Department (HOSPITAL_COMMUNITY): Payer: Medicare (Managed Care)

## 2021-02-28 ENCOUNTER — Emergency Department (HOSPITAL_COMMUNITY)
Admission: EM | Admit: 2021-02-28 | Discharge: 2021-02-28 | Disposition: A | Discharge status: Home / Self Care | Payer: Medicare (Managed Care) | Attending: Emergency Medicine | Admitting: Emergency Medicine

## 2021-02-28 DIAGNOSIS — M50323 Other cervical disc degeneration at C6-C7 level: Secondary | ICD-10-CM | POA: Diagnosis not present

## 2021-02-28 DIAGNOSIS — M50321 Other cervical disc degeneration at C4-C5 level: Secondary | ICD-10-CM | POA: Diagnosis not present

## 2021-02-28 DIAGNOSIS — M47812 Spondylosis without myelopathy or radiculopathy, cervical region: Secondary | ICD-10-CM | POA: Insufficient documentation

## 2021-02-28 DIAGNOSIS — M542 Cervicalgia: Secondary | ICD-10-CM | POA: Diagnosis present

## 2021-02-28 DIAGNOSIS — F17210 Nicotine dependence, cigarettes, uncomplicated: Secondary | ICD-10-CM | POA: Diagnosis not present

## 2021-02-28 DIAGNOSIS — M5030 Other cervical disc degeneration, unspecified cervical region: Secondary | ICD-10-CM

## 2021-02-28 DIAGNOSIS — R519 Headache, unspecified: Secondary | ICD-10-CM | POA: Diagnosis not present

## 2021-02-28 DIAGNOSIS — M50322 Other cervical disc degeneration at C5-C6 level: Secondary | ICD-10-CM | POA: Diagnosis not present

## 2021-02-28 MED ORDER — LIDOCAINE 5 % EX PTCH
2.0000 | MEDICATED_PATCH | CUTANEOUS | Status: DC
  Administered 2021-02-28: 2 via TRANSDERMAL
  Filled 2021-02-28: qty 2

## 2021-02-28 MED ORDER — PREDNISONE 20 MG PO TABS
60.0000 mg | ORAL_TABLET | Freq: Once | ORAL | Status: AC
  Administered 2021-02-28: 60 mg via ORAL
  Filled 2021-02-28: qty 3

## 2021-02-28 MED ORDER — LIDOCAINE 5 % EX PTCH: 1.0000 | MEDICATED_PATCH | CUTANEOUS | 0 refills | Status: AC

## 2021-02-28 MED ORDER — ACETAMINOPHEN 500 MG PO TABS
1000.0000 mg | ORAL_TABLET | Freq: Once | ORAL | Status: AC
  Administered 2021-02-28: 1000 mg via ORAL
  Filled 2021-02-28: qty 2

## 2021-02-28 NOTE — ED Notes (Signed)
Pt care taken, complaining of neck pain, he took tramadol and naproxen for last night.

## 2021-02-28 NOTE — ED Provider Notes (Signed)
[facility] DEPT Provider Note   CSN: [id number] Arrival date & time: 02/28/21  0342     History Chief Complaint  Patient presents with   Neck Pain    Chronic    Travis Weber is a 65 y.o. male.  The history is provided by the patient.  Neck Pain Pain location:  L side Quality:  Cramping Pain radiates to:  L shoulder Pain severity:  Moderate Pain is:  Same all the time Onset quality:  Gradual Duration: years. 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   CHEST PAIN 03/10/2010   DEGENERATIVE [place] DISEASE, LUMBOSACRAL SPINE W/RADICULOPATHY 12/04/2009   ALCOHOL ABUSE, 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/07/2009   HYPERGLYCEMIA 03/02/2008   DYSHIDROTIC ECZEMA, 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   TRIGGER FINGER, LEFT THUMB 04/17/2007   CARPAL TUNNEL SYNDROME, BILATERAL 02/10/2007   HYPERLIPIDEMIA 02/01/2007   LOW BACK PAIN SYNDROME 02/01/2007    Past Surgical History:  Procedure Laterality Date   APPENDECTOMY         Family History  Problem Relation Age of Onset   Heart disease Sister    Hyperlipidemia Sister    Hypertension Sister    Hypertension Brother    Hypertension Brother    Hypertension Sister     Social History   Tobacco Use   Smoking status: Every Day    Packs/day: 1.00    Years: 49.00    Pack years: 49.00    Types: Cigarettes   Smokeless tobacco: Never  Substance Use Topics   Alcohol use: Yes    Alcohol/week: 0.0 standard drinks    Drug use: No    Home Medications Prior to Admission medications   Medication Sig Start Date End Date Taking? Authorizing Provider  clotrimazole-betamethasone (LOTRISONE) cream Apply 1 application topically 2 (two) times daily. Patient not taking: Reported on 12/22/2016 04/21/16   Ivar Drape D, PA  erythromycin [facility]) ophthalmic ointment Place 1 application into the left eye at bedtime. 12/22/16   Ivar Drape D, PA  gabapentin (NEURONTIN) 300 MG capsule Take 2 tablets in the morning, take 1 tablet in the afternoon, and take 2 tablets in the evening. 12/22/16   Ivar Drape D, PA  traMADol (ULTRAM) 50 MG tablet Take 1 tablet (50 mg total) by mouth every 6 (six) hours as needed. 12/22/16   Ivar Drape D, PA  triamcinolone cream (KENALOG) 0.1 % Apply 1 application topically 2 (two) times daily. 05/29/17   Ward, Ozella Almond, PA-C    Allergies    Patient has no known allergies.  Review of Systems   Review of Systems  Constitutional:  Negative for fever.  HENT:  Negative for facial swelling.   Eyes:  Negative for redness.  Respiratory:  Negative for wheezing and stridor.   Cardiovascular:  Negative for chest pain.  Genitourinary:  Negative for bladder incontinence and difficulty urinating.  Musculoskeletal:  Positive for neck pain.  Skin:  Negative for rash.  Neurological:  Negative for numbness and headaches.  Psychiatric/Behavioral:  Negative for agitation.   All other systems reviewed and are negative.  Physical Exam Updated Vital Signs BP (!) 179/110 (BP Location: Right Arm)   Pulse 93   Temp 97.9 F (36.6 C) (Oral)   Resp 20   Ht 5\' 7"  (1.702 m)   Wt 72.6 kg   SpO2 96%   BMI 25.06 kg/m   Physical Exam Vitals and nursing note reviewed.  Constitutional:      General: He is not in acute distress.    Appearance: Normal appearance.  HENT:     Head: Normocephalic and atraumatic.     Nose: Nose normal.  Eyes:     Conjunctiva/sclera: Conjunctivae  normal.     Pupils: Pupils are equal, round, and reactive to light.  Neck:     Vascular: No carotid bruit.  Cardiovascular:     Rate and Rhythm: Normal rate and regular rhythm.     Pulses: Normal pulses.     Heart sounds: Normal heart sounds.  Pulmonary:     Effort: Pulmonary effort is normal.     Breath sounds: Normal breath sounds.  Abdominal:     General: Abdomen is flat. Bowel sounds are normal.     Palpations: Abdomen is soft.     Tenderness: There is no abdominal tenderness. There is no guarding.  Musculoskeletal:        General: Normal range of motion.     Cervical back: Normal range of motion and neck supple. No rigidity or tenderness.  Lymphadenopathy:     Cervical: No cervical adenopathy.  Skin:    General: Skin is warm and dry.     Capillary Refill: Capillary refill takes less than 2 seconds.  Neurological:     General: No focal deficit present.     Mental Status: He is alert and oriented to person, place, and time.     Deep Tendon Reflexes: Reflexes normal.  Psychiatric:        Mood and Affect: Mood normal.        Behavior: Behavior normal.    ED Results / Procedures / Treatments   Labs (all labs ordered are listed, but only abnormal results are displayed) Labs Reviewed - No data to display  EKG None  Radiology CT Head Wo Contrast  Result Date: 02/28/2021 CLINICAL DATA:  65 year old male with history of acute on chronic neck pain. EXAM: CT HEAD WITHOUT CONTRAST CT CERVICAL SPINE WITHOUT CONTRAST TECHNIQUE: Multidetector CT imaging of the head and cervical spine was performed following the standard protocol without intravenous contrast. Multiplanar CT image reconstructions of the cervical spine were also generated. COMPARISON:  No priors. FINDINGS: CT HEAD FINDINGS Brain: No evidence of acute infarction, hemorrhage, hydrocephalus, extra-axial collection or mass lesion/mass effect. Vascular: No hyperdense vessel or unexpected calcification. Small arachnoid  granulation incidentally noted associated with the right transverse sinus (axial image 11 of series 3). Skull: Normal. Negative for fracture or focal lesion. Sinuses/Orbits: No acute finding. Other: None. CT CERVICAL SPINE FINDINGS Alignment: Normal. Skull base and vertebrae: No acute fracture. No primary bone lesion or focal pathologic process. Soft tissues and spinal canal: No prevertebral fluid or swelling. No visible canal hematoma. Disc levels: Multilevel degenerative disc disease, most pronounced at C4-C5, C5-C6 and C6-C7. Moderate multilevel facet arthropathy. Upper chest: Unremarkable. Other: None. IMPRESSION: 1. No acute abnormality of the skull, brain or cervical spine to account for the patient's symptoms. 2. Multilevel degenerative disc disease and  cervical spondylosis, as above. 3. Normal appearance of the brain. Electronically Signed   By: Vinnie Langton M.D.   On: 02/28/2021 06:09   CT Cervical Spine Wo Contrast  Result Date: 02/28/2021 CLINICAL DATA:  65 year old male with history of acute on chronic neck pain. EXAM: CT HEAD WITHOUT CONTRAST CT CERVICAL SPINE WITHOUT CONTRAST TECHNIQUE: Multidetector CT imaging of the head and cervical spine was performed following the standard protocol without intravenous contrast. Multiplanar CT image reconstructions of the cervical spine were also generated. COMPARISON:  No priors. FINDINGS: CT HEAD FINDINGS Brain: No evidence of acute infarction, hemorrhage, hydrocephalus, extra-axial collection or mass lesion/mass effect. Vascular: No hyperdense vessel or unexpected calcification. Small arachnoid granulation incidentally noted associated with the right transverse sinus (axial image 11 of series 3). Skull: Normal. Negative for fracture or focal lesion. Sinuses/Orbits: No acute finding. Other: None. CT CERVICAL SPINE FINDINGS Alignment: Normal. Skull base and vertebrae: No acute fracture. No primary bone lesion or focal pathologic process. Soft tissues and  spinal canal: No prevertebral fluid or swelling. No visible canal hematoma. Disc levels: Multilevel degenerative disc disease, most pronounced at C4-C5, C5-C6 and C6-C7. Moderate multilevel facet arthropathy. Upper chest: Unremarkable. Other: None. IMPRESSION: 1. No acute abnormality of the skull, brain or cervical spine to account for the patient's symptoms. 2. Multilevel degenerative disc disease and cervical spondylosis, as above. 3. Normal appearance of the brain. Electronically Signed   By: Vinnie Langton M.D.   On: 02/28/2021 06:09    Procedures Procedures   Medications Ordered in ED Medications  lidocaine (LIDODERM) 5 % 2 patch (2 patches Transdermal Patch Applied 02/28/21 0519)  predniSONE (DELTASONE) tablet 60 mg (60 mg Oral Given 02/28/21 0520)  acetaminophen (TYLENOL) tablet 1,000 mg (1,000 mg Oral Given 02/28/21 6553)    ED Course  I have reviewed the triage vital signs and the nursing notes.  Pertinent labs & imaging results that were available during my care of the patient were reviewed by me and considered in my medical decision making (see chart for details).   Well appearing.  No trauma.  No neurologic symptoms.  Stable for discharge with close follow up.    Donzetta Matters was evaluated in Emergency Department on 02/28/2021 for the symptoms described in the history of present illness. He was evaluated in the context of the global COVID-19 pandemic, which necessitated consideration that the patient might be at risk for infection with the SARS-CoV-2 virus that causes COVID-19. Institutional protocols and algorithms that pertain to the evaluation of patients at risk for COVID-19 are in a state of rapid change based on information released by regulatory bodies including the CDC and federal and state organizations. These policies and algorithms were followed during the patient's care in the ED.  Final Clinical Impression(s) / ED Diagnoses Final diagnoses:  Neck pain  DDD  (degenerative disc disease), cervical   Return for intractable cough, coughing up blood, fevers > 100.4 unrelieved by medication, shortness of breath, intractable vomiting, chest pain, shortness of breath, weakness, numbness, changes in speech, facial asymmetry, abdominal pain, passing out, Inability to tolerate liquids or food, cough, altered mental status or any concerns. No signs of systemic illness or infection. The patient is nontoxic-appearing on exam and vital signs are within normal limits.  I have reviewed the triage vital signs and the nursing notes. Pertinent labs & imaging results that were available during my care of the patient were reviewed by me and considered in my medical decision making (see chart  for details). After history, exam, and medical workup I feel the patient has been appropriately medically screened and is safe for discharge home. Pertinent diagnoses were discussed with the patient. Patient was given return precautions.  Rx / DC Orders ED Discharge Orders     None        Jiovanni Heeter, MD 02/28/21 [phone]

## 2021-02-28 NOTE — ED Triage Notes (Signed)
Patient presents for acute on chronic neck pain. Patient states about an hour PTA, his neck pain increased and he feels it radiating to his head. Patient is ambulatory, no neuro deficits, no vision changes, no chest pain, arm pain or SOB.
